# Patient Record
Sex: Male | Born: 1960 | Race: White | Hispanic: No | Marital: Married | Smoking: Never smoker
Health system: Southern US, Community
[De-identification: ages and names within clinical notes are randomized; demographics above are authoritative.]

## PROBLEM LIST (undated history)

## (undated) DIAGNOSIS — E785 Hyperlipidemia, unspecified: Secondary | ICD-10-CM

## (undated) DIAGNOSIS — T7840XA Allergy, unspecified, initial encounter: Secondary | ICD-10-CM

## (undated) DIAGNOSIS — I341 Nonrheumatic mitral (valve) prolapse: Secondary | ICD-10-CM

## (undated) DIAGNOSIS — Z8639 Personal history of other endocrine, nutritional and metabolic disease: Secondary | ICD-10-CM

## (undated) DIAGNOSIS — C4491 Basal cell carcinoma of skin, unspecified: Secondary | ICD-10-CM

## (undated) DIAGNOSIS — I10 Essential (primary) hypertension: Secondary | ICD-10-CM

## (undated) HISTORY — DX: Nonrheumatic mitral (valve) prolapse: I34.1

## (undated) HISTORY — DX: Essential (primary) hypertension: I10

## (undated) HISTORY — PX: BACK SURGERY: SHX140

## (undated) HISTORY — DX: Hyperlipidemia, unspecified: E78.5

## (undated) HISTORY — DX: Personal history of other endocrine, nutritional and metabolic disease: Z86.39

## (undated) HISTORY — DX: Allergy, unspecified, initial encounter: T78.40XA

## (undated) HISTORY — DX: Basal cell carcinoma of skin, unspecified: C44.91

---

## 1999-03-23 ENCOUNTER — Encounter: Payer: Self-pay | Admitting: Specialist

## 1999-03-23 ENCOUNTER — Ambulatory Visit (HOSPITAL_COMMUNITY): Admission: RE | Admit: 1999-03-23 | Discharge: 1999-03-23 | Payer: Self-pay | Admitting: Specialist

## 1999-04-06 ENCOUNTER — Observation Stay (HOSPITAL_COMMUNITY): Admission: RE | Admit: 1999-04-06 | Discharge: 1999-04-07 | Payer: Self-pay | Admitting: Orthopedic Surgery

## 1999-04-06 ENCOUNTER — Encounter: Payer: Self-pay | Admitting: Orthopedic Surgery

## 1999-04-06 ENCOUNTER — Encounter (INDEPENDENT_AMBULATORY_CARE_PROVIDER_SITE_OTHER): Payer: Self-pay | Admitting: Specialist

## 2001-06-23 ENCOUNTER — Encounter (INDEPENDENT_AMBULATORY_CARE_PROVIDER_SITE_OTHER): Payer: Self-pay | Admitting: Specialist

## 2001-06-23 ENCOUNTER — Observation Stay (HOSPITAL_COMMUNITY): Admission: RE | Admit: 2001-06-23 | Discharge: 2001-06-24 | Payer: Self-pay | Admitting: Orthopedic Surgery

## 2001-06-23 ENCOUNTER — Encounter: Payer: Self-pay | Admitting: Orthopedic Surgery

## 2004-08-10 ENCOUNTER — Ambulatory Visit: Payer: Self-pay | Admitting: Internal Medicine

## 2004-11-07 ENCOUNTER — Ambulatory Visit: Payer: Self-pay | Admitting: Internal Medicine

## 2005-03-30 ENCOUNTER — Ambulatory Visit: Payer: Self-pay | Admitting: Internal Medicine

## 2005-06-06 ENCOUNTER — Ambulatory Visit: Payer: Self-pay | Admitting: Internal Medicine

## 2005-06-12 ENCOUNTER — Ambulatory Visit: Payer: Self-pay | Admitting: Internal Medicine

## 2005-12-07 ENCOUNTER — Ambulatory Visit: Payer: Self-pay | Admitting: Internal Medicine

## 2006-04-11 ENCOUNTER — Ambulatory Visit: Payer: Self-pay | Admitting: Internal Medicine

## 2006-08-01 ENCOUNTER — Ambulatory Visit: Payer: Self-pay | Admitting: Internal Medicine

## 2006-10-15 DIAGNOSIS — I10 Essential (primary) hypertension: Secondary | ICD-10-CM

## 2006-10-15 DIAGNOSIS — E785 Hyperlipidemia, unspecified: Secondary | ICD-10-CM

## 2007-02-12 ENCOUNTER — Ambulatory Visit: Payer: Self-pay | Admitting: Internal Medicine

## 2007-02-12 DIAGNOSIS — C4490 Unspecified malignant neoplasm of skin, unspecified: Secondary | ICD-10-CM

## 2007-02-12 DIAGNOSIS — R9431 Abnormal electrocardiogram [ECG] [EKG]: Secondary | ICD-10-CM

## 2007-02-12 LAB — CONVERTED CEMR LAB
ALT: 77 units/L — ABNORMAL HIGH (ref 0–53)
AST: 41 units/L — ABNORMAL HIGH (ref 0–37)
BUN: 16 mg/dL (ref 6–23)
CO2: 30 meq/L (ref 19–32)
Calcium: 9.4 mg/dL (ref 8.4–10.5)
Chloride: 106 meq/L (ref 96–112)
Cholesterol: 202 mg/dL (ref 0–200)
Creatinine, Ser: 1.1 mg/dL (ref 0.4–1.5)
Direct LDL: 128.5 mg/dL
GFR calc Af Amer: 94 mL/min
GFR calc non Af Amer: 77 mL/min
Glucose, Bld: 92 mg/dL (ref 70–99)
HDL: 31.7 mg/dL — ABNORMAL LOW (ref >39.0)
Potassium: 3.9 meq/L (ref 3.5–5.1)
Sodium: 141 meq/L (ref 135–145)
Total CHOL/HDL Ratio: 6.4
Triglycerides: 159 mg/dL — ABNORMAL HIGH (ref 0–149)
VLDL: 32 mg/dL (ref 0–40)

## 2007-02-13 ENCOUNTER — Encounter (INDEPENDENT_AMBULATORY_CARE_PROVIDER_SITE_OTHER): Payer: Self-pay | Admitting: *Deleted

## 2007-04-09 ENCOUNTER — Ambulatory Visit: Payer: Self-pay | Admitting: Internal Medicine

## 2007-08-14 ENCOUNTER — Telehealth (INDEPENDENT_AMBULATORY_CARE_PROVIDER_SITE_OTHER): Payer: Self-pay | Admitting: *Deleted

## 2008-01-13 ENCOUNTER — Ambulatory Visit: Payer: Self-pay | Admitting: Internal Medicine

## 2008-03-10 ENCOUNTER — Ambulatory Visit: Payer: Self-pay | Admitting: Internal Medicine

## 2008-03-17 ENCOUNTER — Ambulatory Visit: Payer: Self-pay | Admitting: Internal Medicine

## 2008-03-22 ENCOUNTER — Encounter (INDEPENDENT_AMBULATORY_CARE_PROVIDER_SITE_OTHER): Payer: Self-pay | Admitting: *Deleted

## 2008-03-22 LAB — CONVERTED CEMR LAB
ALT: 38 units/L (ref 0–53)
AST: 22 units/L (ref 0–37)
BUN: 19 mg/dL (ref 6–23)
Basophils Absolute: 0 10*3/uL (ref 0.0–0.1)
Basophils Relative: 0.2 % (ref 0.0–3.0)
CO2: 27 meq/L (ref 19–32)
Calcium: 9.3 mg/dL (ref 8.4–10.5)
Chloride: 110 meq/L (ref 96–112)
Cholesterol: 172 mg/dL (ref 0–200)
Creatinine, Ser: 1.2 mg/dL (ref 0.4–1.5)
Eosinophils Absolute: 0.1 10*3/uL (ref 0.0–0.7)
Eosinophils Relative: 2 % (ref 0.0–5.0)
GFR calc Af Amer: 84 mL/min
GFR calc non Af Amer: 70 mL/min
Glucose, Bld: 94 mg/dL (ref 70–99)
HCT: 47 % (ref 39.0–52.0)
HDL: 30 mg/dL — ABNORMAL LOW (ref >39.0)
Hemoglobin: 16.5 g/dL (ref 13.0–17.0)
LDL Cholesterol: 116 mg/dL — ABNORMAL HIGH (ref 0–99)
Lymphocytes Relative: 27.6 % (ref 12.0–46.0)
MCHC: 35.2 g/dL (ref 30.0–36.0)
MCV: 91.2 fL (ref 78.0–100.0)
Monocytes Absolute: 0.5 10*3/uL (ref 0.1–1.0)
Monocytes Relative: 8.6 % (ref 3.0–12.0)
Neutro Abs: 3.7 10*3/uL (ref 1.4–7.7)
Neutrophils Relative %: 61.6 % (ref 43.0–77.0)
Platelets: 204 10*3/uL (ref 150–400)
Potassium: 4.3 meq/L (ref 3.5–5.1)
RBC: 5.15 M/uL (ref 4.22–5.81)
RDW: 12.5 % (ref 11.5–14.6)
Sodium: 142 meq/L (ref 135–145)
TSH: 1.03 microintl units/mL (ref 0.35–5.50)
Total CHOL/HDL Ratio: 5.7
Triglycerides: 129 mg/dL (ref 0–149)
VLDL: 26 mg/dL (ref 0–40)
WBC: 5.9 10*3/uL (ref 4.5–10.5)

## 2008-05-10 ENCOUNTER — Ambulatory Visit: Payer: Self-pay | Admitting: Internal Medicine

## 2008-05-10 DIAGNOSIS — R5383 Other fatigue: Secondary | ICD-10-CM

## 2008-05-10 DIAGNOSIS — R634 Abnormal weight loss: Secondary | ICD-10-CM

## 2008-05-10 DIAGNOSIS — R5381 Other malaise: Secondary | ICD-10-CM | POA: Insufficient documentation

## 2008-05-10 LAB — CONVERTED CEMR LAB
Blood Glucose, Fingerstick: 114
Hemoglobin: 15.9 g/dL

## 2008-05-11 ENCOUNTER — Ambulatory Visit: Payer: Self-pay | Admitting: Internal Medicine

## 2008-05-11 ENCOUNTER — Encounter (INDEPENDENT_AMBULATORY_CARE_PROVIDER_SITE_OTHER): Payer: Self-pay | Admitting: *Deleted

## 2008-05-12 ENCOUNTER — Ambulatory Visit: Payer: Self-pay | Admitting: Internal Medicine

## 2008-05-12 LAB — CONVERTED CEMR LAB: Fecal Occult Bld: NEGATIVE

## 2008-05-17 ENCOUNTER — Telehealth (INDEPENDENT_AMBULATORY_CARE_PROVIDER_SITE_OTHER): Payer: Self-pay | Admitting: *Deleted

## 2008-05-17 LAB — CONVERTED CEMR LAB
ALT: 45 units/L (ref 0–53)
ANA Titer 1: NEGATIVE
AST: 42 units/L — ABNORMAL HIGH (ref 0–37)
Albumin: 4.1 g/dL (ref 3.5–5.2)
Alkaline Phosphatase: 59 units/L (ref 39–117)
Anti Nuclear Antibody(ANA): POSITIVE — AB
Basophils Absolute: 0 10*3/uL (ref 0.0–0.1)
Basophils Relative: 0 % (ref 0.0–3.0)
Bilirubin, Direct: 0.2 mg/dL (ref 0.0–0.3)
Eosinophils Absolute: 0.2 10*3/uL (ref 0.0–0.7)
Eosinophils Relative: 3.1 % (ref 0.0–5.0)
FSH: 4.2 milliintl units/mL
HCT: 43.2 % (ref 39.0–52.0)
Hemoglobin: 15.1 g/dL (ref 13.0–17.0)
LH: 4.3 milliintl units/mL
Lymphocytes Relative: 29.9 % (ref 12.0–46.0)
MCHC: 35 g/dL (ref 30.0–36.0)
MCV: 90.8 fL (ref 78.0–100.0)
Monocytes Absolute: 0.3 10*3/uL (ref 0.1–1.0)
Monocytes Relative: 3.9 % (ref 3.0–12.0)
Neutro Abs: 4.1 10*3/uL (ref 1.4–7.7)
Neutrophils Relative %: 63.1 % (ref 43.0–77.0)
Platelets: 238 10*3/uL (ref 150–400)
RBC: 4.76 M/uL (ref 4.22–5.81)
RDW: 11.7 % (ref 11.5–14.6)
Rheumatoid fact SerPl-aCnc: <20 intl units/mL — ABNORMAL LOW (ref 0.0–20.0)
Sed Rate: 7 mm/hr (ref 0–16)
Testosterone: 295.68 ng/dL — ABNORMAL LOW (ref 350.00–890)
Total Bilirubin: 1 mg/dL (ref 0.3–1.2)
Total Protein: 6.5 g/dL (ref 6.0–8.3)
WBC: 6.5 10*3/uL (ref 4.5–10.5)

## 2008-05-19 ENCOUNTER — Encounter (INDEPENDENT_AMBULATORY_CARE_PROVIDER_SITE_OTHER): Payer: Self-pay | Admitting: *Deleted

## 2008-06-08 ENCOUNTER — Ambulatory Visit: Payer: Self-pay | Admitting: Internal Medicine

## 2008-06-08 ENCOUNTER — Telehealth: Payer: Self-pay | Admitting: Internal Medicine

## 2008-06-09 ENCOUNTER — Telehealth (INDEPENDENT_AMBULATORY_CARE_PROVIDER_SITE_OTHER): Payer: Self-pay | Admitting: *Deleted

## 2008-07-15 ENCOUNTER — Encounter: Payer: Self-pay | Admitting: Internal Medicine

## 2008-08-16 ENCOUNTER — Encounter: Payer: Self-pay | Admitting: Internal Medicine

## 2008-11-03 ENCOUNTER — Encounter: Payer: Self-pay | Admitting: Internal Medicine

## 2009-03-24 ENCOUNTER — Ambulatory Visit: Payer: Self-pay | Admitting: Internal Medicine

## 2009-05-18 ENCOUNTER — Encounter: Payer: Self-pay | Admitting: Internal Medicine

## 2009-06-02 ENCOUNTER — Ambulatory Visit: Payer: Self-pay | Admitting: Internal Medicine

## 2009-06-07 LAB — CONVERTED CEMR LAB
AST: 25 units/L (ref 0–37)
BUN: 15 mg/dL (ref 6–23)
Basophils Absolute: 0.1 10*3/uL (ref 0.0–0.1)
CO2: 29 meq/L (ref 19–32)
Calcium: 10.1 mg/dL (ref 8.4–10.5)
Chloride: 102 meq/L (ref 96–112)
Creatinine, Ser: 1.1 mg/dL (ref 0.4–1.5)
Eosinophils Absolute: 0.2 10*3/uL (ref 0.0–0.7)
Glucose, Bld: 85 mg/dL (ref 70–99)
HDL: 33 mg/dL — ABNORMAL LOW (ref >39.00)
Lymphocytes Relative: 23.3 % (ref 12.0–46.0)
MCHC: 35.3 g/dL (ref 30.0–36.0)
MCV: 90.7 fL (ref 78.0–100.0)
Monocytes Absolute: 0.5 10*3/uL (ref 0.1–1.0)
Neutrophils Relative %: 67.4 % (ref 43.0–77.0)
Platelets: 223 10*3/uL (ref 150.0–400.0)
RDW: 12.1 % (ref 11.5–14.6)
TSH: 0.98 microintl units/mL (ref 0.35–5.50)
Triglycerides: 185 mg/dL — ABNORMAL HIGH (ref 0.0–149.0)

## 2009-06-20 ENCOUNTER — Encounter (INDEPENDENT_AMBULATORY_CARE_PROVIDER_SITE_OTHER): Payer: Self-pay | Admitting: *Deleted

## 2009-06-20 ENCOUNTER — Ambulatory Visit: Payer: Self-pay | Admitting: Internal Medicine

## 2009-06-22 ENCOUNTER — Encounter (INDEPENDENT_AMBULATORY_CARE_PROVIDER_SITE_OTHER): Payer: Self-pay | Admitting: *Deleted

## 2009-06-25 HISTORY — PX: VARICOCELE EXCISION: SUR582

## 2009-07-01 ENCOUNTER — Ambulatory Visit (HOSPITAL_BASED_OUTPATIENT_CLINIC_OR_DEPARTMENT_OTHER): Admission: RE | Admit: 2009-07-01 | Discharge: 2009-07-01 | Payer: Self-pay | Admitting: Urology

## 2009-07-26 ENCOUNTER — Ambulatory Visit: Payer: Self-pay | Admitting: Family Medicine

## 2009-07-26 LAB — CONVERTED CEMR LAB: Inflenza A Ag: NEGATIVE

## 2009-09-15 IMAGING — CR DG CHEST 2V
2 series · 2 of 2 positions shown · non-contrast
Comparison: 08/01/2006

CLINICAL DATA: Weight loss, chronic cough and shortness of breath.

CHEST - 2 VIEW

[view not recorded (1 of 2)]
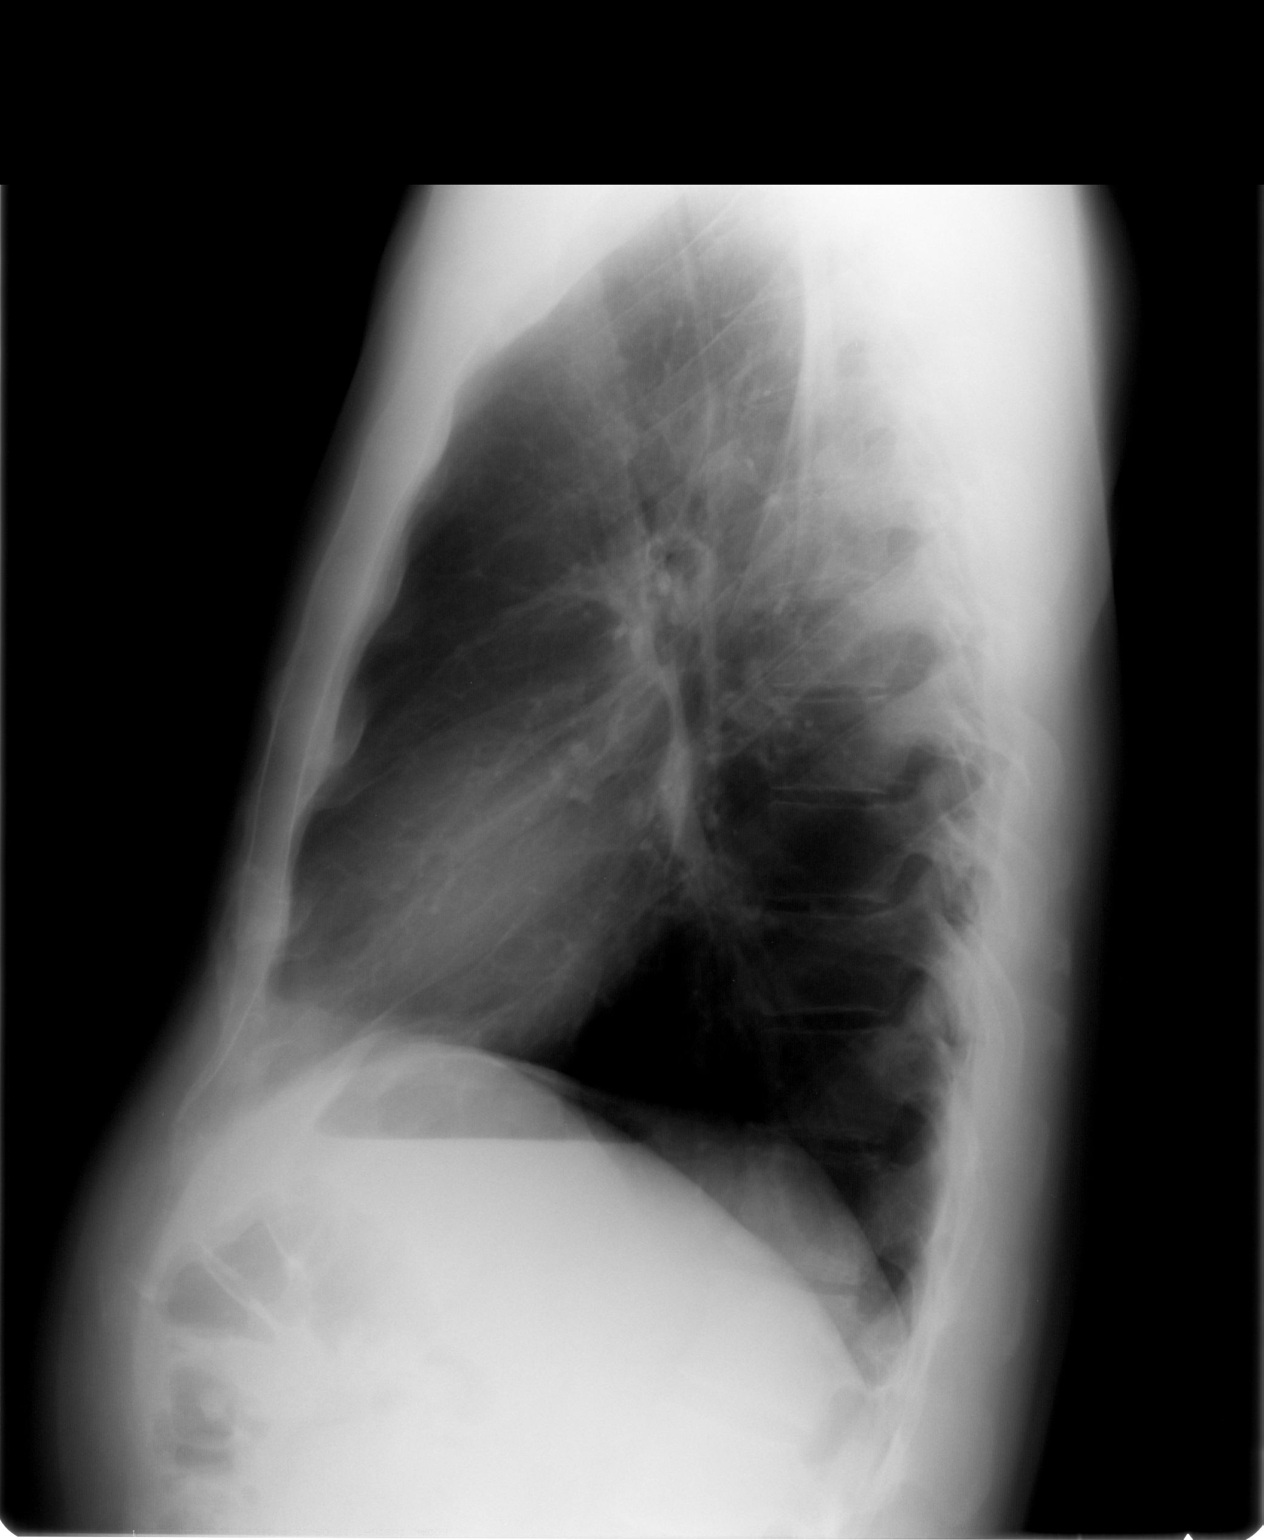

[view not recorded (2 of 2)]
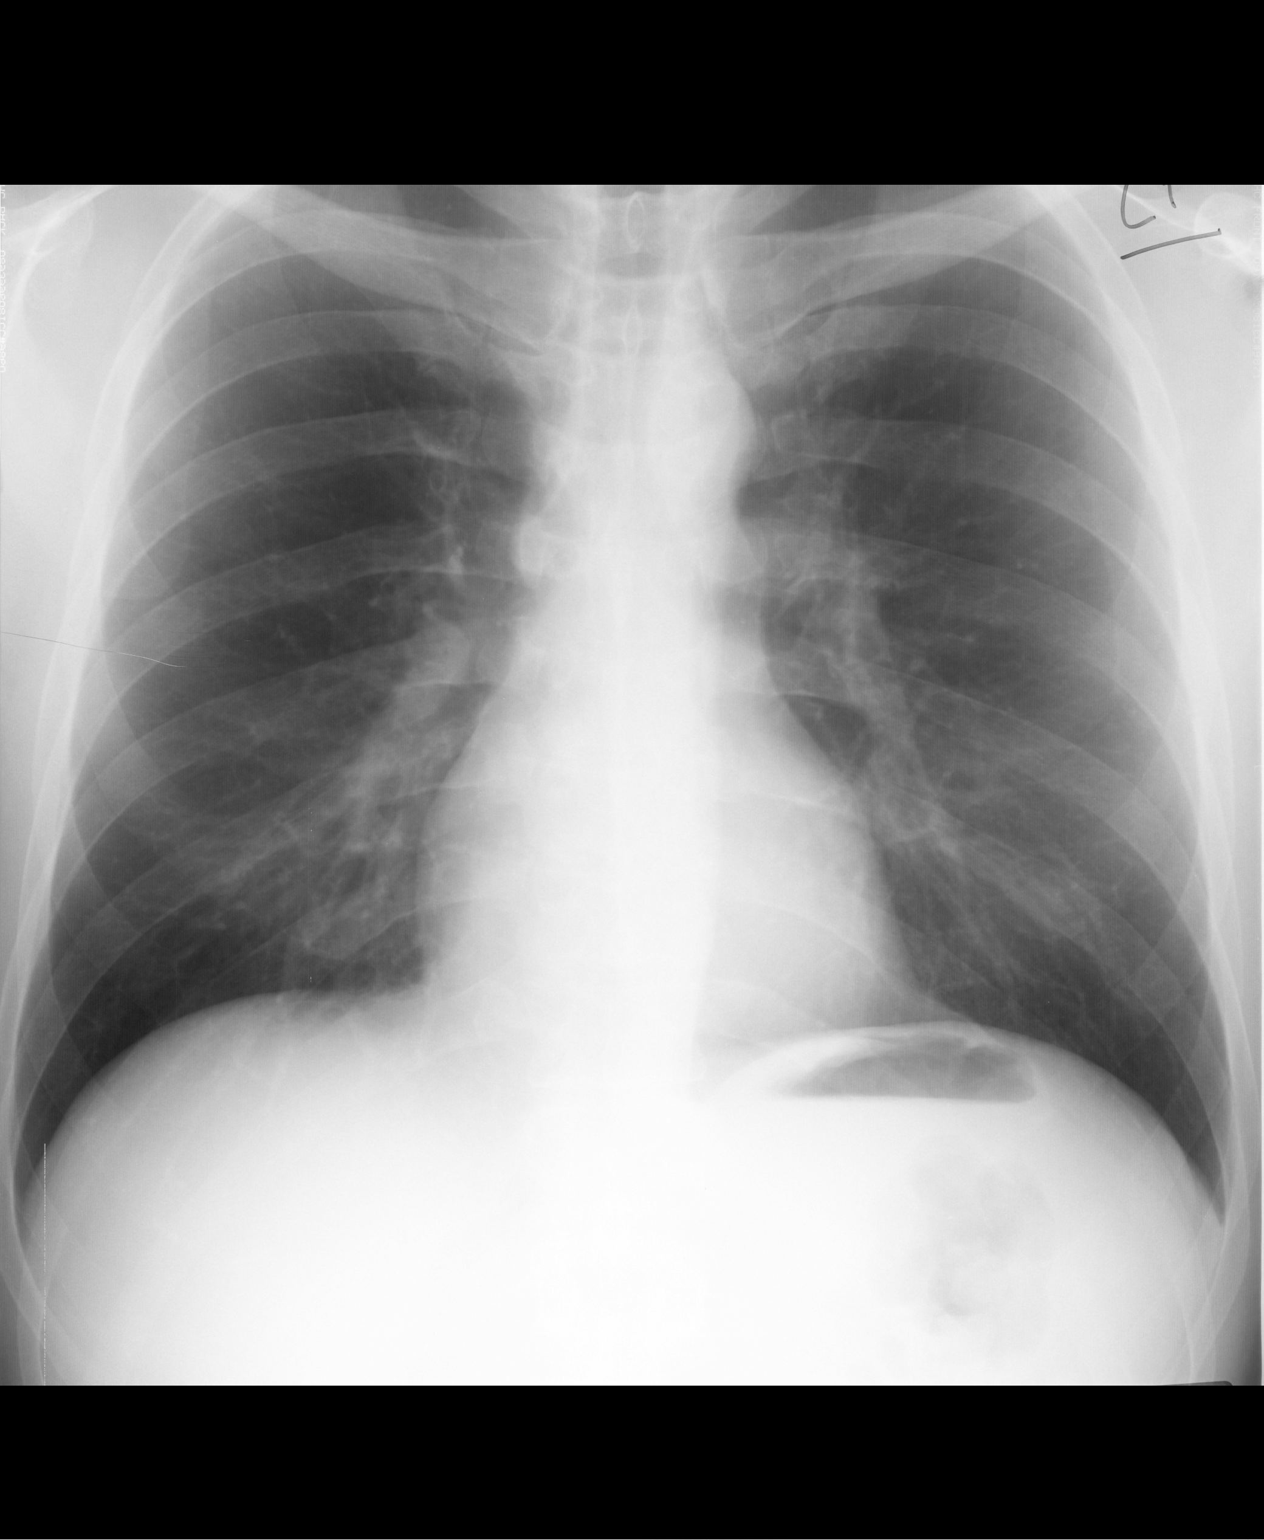

[2 of 2 positions shown; findings below may reference images not displayed]

FINDINGS: The cardiac silhouette, mediastinal and hilar contours
are within normal limits and stable. The lungs are clear.  No
pleural effusions. Prominent infrahilar vessel on the right due to
a mild pectus deformity.  Intact bony thorax.
IMPRESSION: 1.  No acute cardiopulmonary findings.  Stable appearance of the
chest since 6997.

## 2009-11-02 ENCOUNTER — Encounter: Payer: Self-pay | Admitting: Internal Medicine

## 2010-06-06 ENCOUNTER — Ambulatory Visit: Payer: Self-pay | Admitting: Internal Medicine

## 2010-06-07 ENCOUNTER — Encounter (INDEPENDENT_AMBULATORY_CARE_PROVIDER_SITE_OTHER): Payer: Self-pay | Admitting: *Deleted

## 2010-06-09 LAB — CONVERTED CEMR LAB
Basophils Absolute: 0 10*3/uL (ref 0.0–0.1)
Basophils Relative: 0.5 % (ref 0.0–3.0)
Bilirubin, Direct: 0.1 mg/dL (ref 0.0–0.3)
CO2: 28 meq/L (ref 19–32)
Calcium: 9.4 mg/dL (ref 8.4–10.5)
Direct LDL: 150.3 mg/dL
GFR calc non Af Amer: 100.86 mL/min (ref >60.00)
Glucose, Bld: 91 mg/dL (ref 70–99)
HCT: 45 % (ref 39.0–52.0)
HDL: 35.5 mg/dL — ABNORMAL LOW (ref >39.00)
Hemoglobin: 15.4 g/dL (ref 13.0–17.0)
Lymphocytes Relative: 25.3 % (ref 12.0–46.0)
Lymphs Abs: 1.9 10*3/uL (ref 0.7–4.0)
Monocytes Relative: 6 % (ref 3.0–12.0)
Neutro Abs: 5 10*3/uL (ref 1.4–7.7)
Potassium: 4.1 meq/L (ref 3.5–5.1)
RBC: 4.96 M/uL (ref 4.22–5.81)
RDW: 13.4 % (ref 11.5–14.6)
Sodium: 139 meq/L (ref 135–145)
TSH: 0.8 microintl units/mL (ref 0.35–5.50)
Total Bilirubin: 0.9 mg/dL (ref 0.3–1.2)
Total CHOL/HDL Ratio: 6
VLDL: 23 mg/dL (ref 0.0–40.0)

## 2010-06-29 ENCOUNTER — Other Ambulatory Visit: Payer: Self-pay | Admitting: Internal Medicine

## 2010-06-29 ENCOUNTER — Ambulatory Visit
Admission: RE | Admit: 2010-06-29 | Discharge: 2010-06-29 | Payer: Self-pay | Source: Home / Self Care | Attending: Internal Medicine | Admitting: Internal Medicine

## 2010-06-29 LAB — FECAL OCCULT BLOOD, IMMUNOCHEMICAL: Fecal Occult Bld: NEGATIVE

## 2010-07-25 NOTE — Assessment & Plan Note (Signed)
Summary: C OLD?/KDC   Vital Signs:  Patient profile:   50 year old male Weight:      190 pounds Temp:     98.4 degrees F oral Pulse rate:   88 / minute Pulse rhythm:   regular BP sitting:   124 / 80  (left arm) Cuff size:   regular  Vitals Entered By: Army Fossa CMA (July 26, 2009 11:04 AM) CC: Pt c/o friday had fever of 102, chest congestion, nasal congestion. Has not had fever since friday/saturday. , Cough   History of Present Illness:  Cough      This is a 50 year old man who presents with Cough.  The symptoms began 5 days ago.  The patient reports productive cough and fever, but denies non-productive cough, pleuritic chest pain, shortness of breath, wheezing, exertional dyspnea, hemoptysis, and malaise.  Associated symtpoms include cold/URI symptoms.  The patient denies the following symptoms: sore throat, nasal congestion, chronic rhinitis, weight loss, acid reflux symptoms, and peripheral edema.  The cough is worse with activity.  Ineffective prior treatments have included OTC cough medication.    Current Medications (verified): 1)  Lisinopril 20 Mg Tabs (Lisinopril) .... Two Times A Day 2)  Zithromax Z-Pak 250 Mg Tabs (Azithromycin) .... As Directed 3)  Proair Hfa 108 (90 Base) Mcg/act Aers (Albuterol Sulfate) .... 2 Puffs Qid As Needed  Allergies (verified): No Known Drug Allergies  Past History:  Past medical, surgical, family and social histories (including risk factors) reviewed for relevance to current acute and chronic problems.  Past Medical History: Reviewed history from 06/02/2009 and no changes required. Hyperlipidemia Hypertension NEOP, MALIGNANT, SKIN (face, sees dermatology)  ? of MITRAL VALVE PROLAPSE (Dx in the 90s,no need for SBE prophy per new guidelines) sees Dr Talmage Nap for low testosterone  Past Surgical History: Reviewed history from 02/12/2007 and no changes required. BACK SURGER 2000/2002  Family History: Reviewed history from  06/02/2009 and no changes required. Father:SCC of skin, arrythmia Mother: increased chol Siblings: on coumadin reason? arrythmia MI --GF 49 y/o colon Ca-- GF age early 27s  DM--no prostate cancer--no  Social History: Reviewed history from 06/02/2009 and no changes required. Married bussines analyst Alcohol use-yes tobacco--no no children exercise , very little  diet-- does watch   Review of Systems      See HPI  Physical Exam  General:  Well-developed,well-nourished,in no acute distress; alert,appropriate and cooperative throughout examination Ears:  External ear exam shows no significant lesions or deformities.  Otoscopic examination reveals clear canals, tympanic membranes are intact bilaterally without bulging, retraction, inflammation or discharge. Hearing is grossly normal bilaterally. Nose:  External nasal examination shows no deformity or inflammation. Nasal mucosa are pink and moist without lesions or exudates. Mouth:  Oral mucosa and oropharynx without lesions or exudates.  Teeth in good repair. Neck:  No deformities, masses, or tenderness noted. Lungs:  R decreased breath sounds and L decreased breath sounds-----improved after neb with albuterol Heart:  normal rate and no murmur.     Impression & Recommendations:  Problem # 1:  BRONCHITIS- ACUTE (ICD-466.0)  His updated medication list for this problem includes:    Zithromax Z-pak 250 Mg Tabs (Azithromycin) .Marland Kitchen... As directed    Proair Hfa 108 (90 Base) Mcg/act Aers (Albuterol sulfate) .Marland Kitchen... 2 puffs qid as needed  Take antibiotics and other medications as directed. Encouraged to push clear liquids, get enough rest, and take acetaminophen as needed. To be seen in 5-7 days if no improvement, sooner  if worse.  Orders: Nebulizer Tx (16109) Albuterol Sulfate Sol 1mg  unit dose (U0454)  Complete Medication List: 1)  Lisinopril 20 Mg Tabs (Lisinopril) .... Two times a day 2)  Zithromax Z-pak 250 Mg Tabs  (Azithromycin) .... As directed 3)  Proair Hfa 108 (90 Base) Mcg/act Aers (Albuterol sulfate) .... 2 puffs qid as needed Prescriptions: PROAIR HFA 108 (90 BASE) MCG/ACT AERS (ALBUTEROL SULFATE) 2 puffs qid as needed  #1 x 0   Entered and Authorized by:   Loreen Freud DO   Signed by:   Loreen Freud DO on 07/26/2009   Method used:   Historical   RxID:   [id number] ZITHROMAX Z-PAK 250 MG TABS (AZITHROMYCIN) as directed  #1 x 0   Entered and Authorized by:   Loreen Freud DO   Signed by:   Loreen Freud DO on 07/26/2009   Method used:   Electronically to        [facility]* (retail)       [address]       [place], [place]  [id number]       Ph: [phone]       Fax: [phone]   RxID:   [id number]   Laboratory Results    Other Tests  Influenza A: negative Comments: Army Fossa CMA  July 26, 2009 11:34 AM     Medication Administration  Medication # 1:    Medication: Albuterol Sulfate Sol 1mg  unit dose    Diagnosis: BRONCHITIS- ACUTE (ICD-466.0)  Orders Added: 1)  Est. Patient Level IV [87564] 2)  Nebulizer Tx [33295] 3)  Albuterol Sulfate Sol 1mg  unit dose [J8841]

## 2010-07-25 NOTE — Letter (Signed)
Summary: [facility]  [facility]   Imported By: Lanelle Bal 11/10/2009 13:00:13  _____________________________________________________________________  External Attachment:    Type:   Image     Comment:   External Document

## 2010-07-27 NOTE — Assessment & Plan Note (Signed)
Summary: cpx//pt will be fasting//lch   Vital Signs:  Patient profile:   50 year old male Height:      75 inches Weight:      188.25 pounds BMI:     23.61 Pulse rate:   79 / minute Pulse rhythm:   regular BP sitting:   134 / 88  (left arm) Cuff size:   regular  Vitals Entered By: Army Fossa CMA (June 06, 2010 9:37 AM) CC: CPX, fasting  Comments [facility]    History of Present Illness: cpx   Current Medications (verified): 1)  Lisinopril 20 Mg Tabs (Lisinopril) .... Two Times A Day  Allergies (verified): No Known Drug Allergies  Past History:  Past Medical History: Hyperlipidemia Hypertension NEOP, MALIGNANT, SKIN (face, sees dermatology)  ? of MITRAL VALVE PROLAPSE (Dx in the 90s,no need for SBE prophy per new guidelines) sees Dr Talmage Nap for low testosterone, problem self resolve (2011) discharge from endocrine  Past Surgical History: BACK SURGER 2000/2002 varicocele correction 2011  Family History: Reviewed history from 06/02/2009 and no changes required. Father:SCC of skin, arrythmia Mother: increased chol Siblings: on coumadin reason? 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 CV:  Denies chest pain or discomfort, palpitations, and swelling of feet. Resp:  Denies cough and wheezing. GI:  Denies bloody stools, nausea, and vomiting. GU:  Denies dysuria, hematuria, urinary frequency, and urinary hesitancy. Derm:  sees derm routinely . Psych:  Denies anxiety and depression. Endo:  testosterone back to normal w/o Rx, d/c from endocrinology.  Physical Exam  General:  alert, well-developed, and well-nourished.   Neck:  No deformities, masses, or tenderness noted. no  thyromegaly Lungs:  normal respiratory effort, no intercostal retractions, no accessory muscle use, and normal breath sounds.   Heart:  normal rate, regular rhythm, and no murmur.   Abdomen:  soft, non-tender, no distention, no masses, no guarding, and no rigidity.   Extremities:  no pretibial edema bilaterally  Psych:  Cognition and judgment appear intact. Alert and cooperative with normal attention span and concentration.  not anxious appearing and not depressed appearing.     Impression & Recommendations:  Problem # 1:  HEALTH MAINTENANCE EXAM (ICD-V70.0)  Td 2006 had a  flu shot  03-2010  never had a Cscope , (-) iFOB 05-2009 grandfather has a history of colon cancer, d/w patient Cscope Vs iFOB iFOB provided, Cscope if so desire . definitely needs a colonoscopy at age 48  seems to be eating healthy encourage to exercise more particularly during the winter    Orders: Venipuncture (78469) TLB-BMP (Basic Metabolic Panel-BMET) (80048-METABOL) TLB-CBC Platelet - w/Differential (85025-CBCD) TLB-Lipid Panel (80061-LIPID) TLB-TSH (Thyroid Stimulating Hormone) (84443-TSH) TLB-Hepatic/Liver Function Pnl (80076-HEPATIC) Specimen Handling (62952)  Problem # 2:  HYPERTENSION (ICD-401.9) diastolic BP slightly elevated, see instructions will call for refills His updated medication list for this problem includes:    Lisinopril 20 Mg Tabs (Lisinopril) .Marland Kitchen..Marland Kitchen Two times a day  BP today: 134/88 Prior BP: 124/80 (07/26/2009)  Labs Reviewed: K+: 4.2 (06/02/2009) Creat: : 1.1 (06/02/2009)   Chol: 230 (06/02/2009)   HDL: 33.00 (06/02/2009)   LDL: 116 (03/17/2008)   TG: 185.0 (06/02/2009)  Problem # 3:  HYPERLIPIDEMIA (ICD-272.4) labs Labs Reviewed: SGOT: 25 (  06/02/2009)   SGPT: 33 (06/02/2009)   HDL:33.00 (06/02/2009), 30.0 (03/17/2008)  LDL:116 (03/17/2008), DEL (02/12/2007)  Chol:230 (06/02/2009), 172 (03/17/2008)  Trig:185.0 (06/02/2009), 129 (03/17/2008)  Complete Medication  List: 1)  Lisinopril 20 Mg Tabs (Lisinopril) .... Two times a day  Patient Instructions: 1)  Check your blood pressure 2 or 3 times a week. If it is more than 140/85 consistently,please let us know  2)  return the iFOB 3)  Please schedule a follow-up appointment in 1 year.    Orders Added: 1)  Venipuncture [36415] 2)  TLB-BMP (Basic Metabolic Panel-BMET) [80048-METABOL] 3)  TLB-CBC Platelet - w/Differential [85025-CBCD] 4)  TLB-Lipid Panel [80061-LIPID] 5)  TLB-TSH (Thyroid Stimulating Hormone) [84443-TSH] 6)  TLB-Hepatic/Liver Function Pnl [80076-HEPATIC] 7)  Specimen Handling [99000] 8)  Est. Patient age 22-64 [phone]

## 2010-07-27 NOTE — Letter (Signed)
Summary: [facility]: Immunoassay Fecal Occult Blood (iFOB) Order Form  [facility] [place]  [address] [place], [place] [address]   Phone: [phone]  Fax: [phone]      [facility]: Immunoassay Fecal Occult Blood (iFOB) Order Form   June 07, 2010 MRN: [id number]   ZYREE [facility] 18-Jul-1960   Physicican Name:_jose paz,md ________________________  Diagnosis Code:____v76.51______________________      Army Fossa CMA

## 2010-09-10 LAB — POCT I-STAT, CHEM 8
Creatinine, Ser: 1 mg/dL (ref 0.4–1.5)
Glucose, Bld: 98 mg/dL (ref 70–99)
HCT: 47 % (ref 39.0–52.0)
Hemoglobin: 16 g/dL (ref 13.0–17.0)
Potassium: 4.8 mEq/L (ref 3.5–5.1)
Sodium: 139 mEq/L (ref 135–145)
TCO2: 26 mmol/L (ref 0–100)

## 2010-11-10 NOTE — Op Note (Signed)
[facility]  Patient:    Gary Curry, Gary Curry Visit Number: [id number] MRN: [id number]          Service Type: OBV Location: 4W 0466 01 Attending Physician:  Skip Mayer Dictated by:   Georges Lynch Darrelyn Hillock, M.D. Proc. Date: 06/23/01 Admit Date:  06/23/2001                             Operative Report  SURGEON:  Windy Fast A. Darrelyn Hillock, M.D.  ASSISTANT:  Javier Docker, M.D.  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 He had toe extensor weakness preop. Note he had a previous lumbar laminectomy at the same level in October 2000.  POSTOPERATIVE DIAGNOSES:  Not given.  DESCRIPTION OF PROCEDURE:  With the patient on a spinal frame, routine orthopedic prep and draping of the back was carried out. The patient had 2 gm of ampicillin. Following this, an incision was made through the old incision site. I identified what I felt was the spinous process of L4. I went down and identified the L4-5 space on the right and an x-ray was taken that verified our exact position. I then removed all the scar tissue between the lamina and this area. I then carried out a hemilaminectomy in the usual fashion. I went down and identified the dura and the nerve root. The nerve root was severely compressed. He had a lateral recessed stenosis. Once I decompressed the lateral recess and did a foraminotomy, I then was able to easily get a Penfield 4 up under the nerve root. Once this was done, large fragments of disk were extruded out up under the root and they were removed with the nerve hook. I then went down in the disk space and completed the diskectomy. Multiple pieces of disk material were removed. Once this was completed, the nerve root now was free. We were able to easily get the dura over. We then went down with a hockey stick into the foramina and the root was clear. We thoroughly irrigated out the area. I loosely applied some  thrombin soaked Gelfoam and the wound was closed in layers in the usual fashion. The patient left the operating room in satisfactory condition after a sterile Neosporin dressing was applied. Dictated by:   Georges Lynch Darrelyn Hillock, M.D. Attending Physician:  Skip Mayer DD:  06/23/01 TD:  06/23/01 Job: [id number]

## 2011-06-11 ENCOUNTER — Encounter: Payer: Self-pay | Admitting: Internal Medicine

## 2011-06-12 ENCOUNTER — Ambulatory Visit (INDEPENDENT_AMBULATORY_CARE_PROVIDER_SITE_OTHER): Payer: 59 | Admitting: Internal Medicine

## 2011-06-12 DIAGNOSIS — E291 Testicular hypofunction: Secondary | ICD-10-CM

## 2011-06-12 DIAGNOSIS — Z0000 Encounter for general adult medical examination without abnormal findings: Secondary | ICD-10-CM

## 2011-06-12 DIAGNOSIS — I10 Essential (primary) hypertension: Secondary | ICD-10-CM

## 2011-06-12 LAB — LIPID PANEL
Cholesterol: 228 mg/dL — ABNORMAL HIGH (ref 0–200)
HDL: 43.3 mg/dL (ref >39.00)
Total CHOL/HDL Ratio: 5
Triglycerides: 152 mg/dL — ABNORMAL HIGH (ref 0.0–149.0)
VLDL: 30.4 mg/dL (ref 0.0–40.0)

## 2011-06-12 LAB — BASIC METABOLIC PANEL
BUN: 19 mg/dL (ref 6–23)
Chloride: 105 mEq/L (ref 96–112)
Creatinine, Ser: 1 mg/dL (ref 0.4–1.5)
GFR: 86.38 mL/min (ref >60.00)
Potassium: 4.6 mEq/L (ref 3.5–5.1)

## 2011-06-12 LAB — ALT: ALT: 41 U/L (ref 0–53)

## 2011-06-12 LAB — CBC WITH DIFFERENTIAL/PLATELET
Basophils Relative: 0.7 % (ref 0.0–3.0)
Eosinophils Relative: 2.7 % (ref 0.0–5.0)
Monocytes Relative: 6.6 % (ref 3.0–12.0)
Neutrophils Relative %: 62.3 % (ref 43.0–77.0)
Platelets: 232 10*3/uL (ref 150.0–400.0)
RBC: 5.12 Mil/uL (ref 4.22–5.81)
WBC: 6.5 10*3/uL (ref 4.5–10.5)

## 2011-06-12 LAB — TSH: TSH: 0.88 u[IU]/mL (ref 0.35–5.50)

## 2011-06-12 LAB — AST: AST: 27 U/L (ref 0–37)

## 2011-06-12 LAB — LDL CHOLESTEROL, DIRECT: Direct LDL: 155.5 mg/dL

## 2011-06-12 MED ORDER — LISINOPRIL 20 MG PO TABS: 20.0000 mg | ORAL_TABLET | Freq: Every day | ORAL | Status: DC

## 2011-06-12 NOTE — Assessment & Plan Note (Addendum)
At some point, his testosterone was low, it self resolved. We are checking his testosterone today, it is normal, he does not need to continue seeing endocrinology, we could continue monitoring his testosterone from time to time .

## 2011-06-12 NOTE — Assessment & Plan Note (Signed)
Reports good amb BPs and med compliance

## 2011-06-12 NOTE — Progress Notes (Signed)
  Subjective:    Patient ID: Gary Curry, male    DOB: May 31, 1961, 50 y.o.   MRN: [id number]  HPI CPX Feels well, likes his testosterone check  Past Medical History: Hyperlipidemia Hypertension NEOP, MALIGNANT, SKIN (face, sees dermatology)  ? 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 Fatigue: occ fatigue, "not really an issue"  Respiratory: Negative for cough and shortness of breath.   Cardiovascular: Negative for chest pain and leg swelling.  Gastrointestinal: Negative for abdominal pain and blood in stool.  Genitourinary: Negative for dysuria and hematuria.       No ED  Psychiatric/Behavioral:       No anxiety-depression       Objective:   Physical Exam  Constitutional: He appears well-developed and well-nourished. No distress.  HENT:  Head: Normocephalic and atraumatic.  Neck: No thyromegaly present.  Cardiovascular: Normal rate, regular rhythm and normal heart sounds.   No murmur heard. Pulmonary/Chest: Effort normal and breath sounds normal. No respiratory distress. He has no wheezes. He has no rales.  Abdominal: Soft. Bowel sounds are normal. He exhibits no distension. There is no tenderness. There is no rebound and no guarding.  Genitourinary: Rectum normal and prostate normal. Guaiac negative stool.  Musculoskeletal: He exhibits no  edema.  Neurological: He is alert.  Skin: He is not diaphoretic.  Psychiatric: He has a normal mood and affect. His behavior is normal. Judgment and thought content normal.      Assessment & Plan:

## 2011-06-12 NOTE — Patient Instructions (Signed)
Exercise daily ! Watch your diet Next visit in 1 year and as needed Check the  blood pressure 2 or 3 times a week, be sure it is less than 135/85. If it is consistently higher, let me know

## 2011-06-12 NOTE — Assessment & Plan Note (Addendum)
Td 2006 had a  flu shot  For 2012  never had a Cscope , (-) iFOB 2011 grandfather has a history of colon cancer, ready to have a cscope at age 50 iFOB provided encourage to exercise more particularly during the winter

## 2011-06-13 ENCOUNTER — Encounter: Payer: Self-pay | Admitting: Internal Medicine

## 2011-06-13 ENCOUNTER — Encounter: Payer: Self-pay | Admitting: *Deleted

## 2011-06-17 ENCOUNTER — Encounter: Payer: Self-pay | Admitting: Internal Medicine

## 2011-07-03 ENCOUNTER — Other Ambulatory Visit: Payer: Self-pay | Admitting: Internal Medicine

## 2011-07-03 ENCOUNTER — Encounter: Payer: Self-pay | Admitting: Internal Medicine

## 2011-07-03 ENCOUNTER — Other Ambulatory Visit: Payer: 59

## 2011-07-03 DIAGNOSIS — Z1211 Encounter for screening for malignant neoplasm of colon: Secondary | ICD-10-CM

## 2011-07-03 LAB — FECAL OCCULT BLOOD, IMMUNOCHEMICAL: Fecal Occult Bld: NEGATIVE

## 2012-06-20 ENCOUNTER — Encounter: Payer: Self-pay | Admitting: Internal Medicine

## 2012-06-20 ENCOUNTER — Ambulatory Visit (INDEPENDENT_AMBULATORY_CARE_PROVIDER_SITE_OTHER): Payer: 59 | Admitting: Internal Medicine

## 2012-06-20 VITALS — BP 140/86 | HR 92 | Temp 98.0°F | Ht 75.0 in | Wt 193.0 lb

## 2012-06-20 DIAGNOSIS — Z0000 Encounter for general adult medical examination without abnormal findings: Secondary | ICD-10-CM

## 2012-06-20 DIAGNOSIS — I10 Essential (primary) hypertension: Secondary | ICD-10-CM

## 2012-06-20 MED ORDER — LISINOPRIL 20 MG PO TABS: 20.0000 mg | ORAL_TABLET | Freq: Every day | ORAL | Status: DC

## 2012-06-20 NOTE — Progress Notes (Signed)
  Subjective:    Patient ID: Gary Curry, male    DOB: 05-Nov-1960, 51 y.o.   MRN: [id number]  HPI CPX   Past Medical History: Hyperlipidemia Hypertension NEOP, MALIGNANT, SKIN (face, sees dermatology)   ? 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 No chest pain or shortness of breath Denies nausea, vomiting, diarrhea or blood in the stools. No dysuria or gross hematuria No anxiety or depression    Objective:   Physical Exam  General -- alert, well-developed, and well-nourished.   Neck --no thyromegaly Lungs -- normal respiratory effort, no intercostal retractions, no accessory muscle use, and normal breath sounds.   Heart-- normal rate, regular rhythm, no murmur, and no gallop.   Abdomen--soft, non-tender, no distention, no masses, no HSM, no guarding, and no rigidity.   Extremities-- no pretibial edema bilaterally Rectal-- No external abnormalities noted. Normal sphincter tone. No rectal masses or tenderness. Brown stool, Hemoccult negative Prostate:  Prostate gland firm and smooth, no enlargement, nodularity, tenderness, mass, asymmetry or induration. Neurologic-- alert & oriented X3 and strength normal in all extremities. Psych-- Cognition and judgment appear intact. Alert and cooperative with  normal attention span and concentration.  not anxious appearing and not depressed appearing.       Assessment & Plan:

## 2012-06-20 NOTE — Assessment & Plan Note (Addendum)
Td 2006 had a  flu shot   never had a Cscope , grandfather has a history of colon cancer --> refer to GI Continue with healthy diet, exercise! Labs Reports  a 2 mm induration at the fourth left digit, exam is confirmatory, saw dermatology few days ago, was told was probably a cyst. I agree, if the area gets worse, bigger or start hurting will let me know for orthopedic referral.

## 2012-06-20 NOTE — Assessment & Plan Note (Signed)
Ambulatory BPs always less than 120/86. No change

## 2012-06-20 NOTE — Patient Instructions (Addendum)
Come back fasting for blood: FLP, CMP, CBC, TSH, PSA--- dx v70 Check the  blood pressure 2 or 3 times a week, be sure it is between 110/60 and 135/85. If it is consistently higher or lower, let me know

## 2012-06-23 ENCOUNTER — Encounter: Payer: Self-pay | Admitting: Internal Medicine

## 2012-06-24 ENCOUNTER — Other Ambulatory Visit (INDEPENDENT_AMBULATORY_CARE_PROVIDER_SITE_OTHER): Payer: 59

## 2012-06-24 DIAGNOSIS — Z0000 Encounter for general adult medical examination without abnormal findings: Secondary | ICD-10-CM

## 2012-06-24 LAB — COMPREHENSIVE METABOLIC PANEL
ALT: 60 U/L — ABNORMAL HIGH (ref 0–53)
BUN: 20 mg/dL (ref 6–23)
CO2: 27 mEq/L (ref 19–32)
Calcium: 9.5 mg/dL (ref 8.4–10.5)
Chloride: 102 mEq/L (ref 96–112)
Creatinine, Ser: 1 mg/dL (ref 0.4–1.5)
GFR: 87.04 mL/min (ref >60.00)
Glucose, Bld: 93 mg/dL (ref 70–99)

## 2012-06-24 LAB — CBC WITH DIFFERENTIAL/PLATELET
Basophils Absolute: 0 10*3/uL (ref 0.0–0.1)
Hemoglobin: 15.4 g/dL (ref 13.0–17.0)
Lymphocytes Relative: 21.5 % (ref 12.0–46.0)
Monocytes Relative: 9.2 % (ref 3.0–12.0)
Neutro Abs: 4.1 10*3/uL (ref 1.4–7.7)
Platelets: 204 10*3/uL (ref 150.0–400.0)
RDW: 12.8 % (ref 11.5–14.6)
WBC: 6.1 10*3/uL (ref 4.5–10.5)

## 2012-06-24 LAB — LIPID PANEL
Cholesterol: 224 mg/dL — ABNORMAL HIGH (ref 0–200)
Total CHOL/HDL Ratio: 6

## 2012-06-24 LAB — TSH: TSH: 0.96 u[IU]/mL (ref 0.35–5.50)

## 2012-06-24 LAB — LDL CHOLESTEROL, DIRECT: Direct LDL: 154.9 mg/dL

## 2012-06-27 ENCOUNTER — Encounter: Payer: Self-pay | Admitting: *Deleted

## 2012-07-10 ENCOUNTER — Ambulatory Visit (AMBULATORY_SURGERY_CENTER): Payer: 59 | Admitting: *Deleted

## 2012-07-10 ENCOUNTER — Encounter: Payer: Self-pay | Admitting: Internal Medicine

## 2012-07-10 VITALS — Ht 75.0 in | Wt 194.0 lb

## 2012-07-10 DIAGNOSIS — Z1211 Encounter for screening for malignant neoplasm of colon: Secondary | ICD-10-CM

## 2012-07-10 MED ORDER — MOVIPREP 100 G PO SOLR: 1.0000 | Freq: Once | ORAL | Status: DC

## 2012-07-24 ENCOUNTER — Encounter: Payer: Self-pay | Admitting: Internal Medicine

## 2012-07-24 ENCOUNTER — Ambulatory Visit (AMBULATORY_SURGERY_CENTER): Payer: 59 | Admitting: Internal Medicine

## 2012-07-24 VITALS — BP 123/82 | HR 66 | Temp 97.2°F | Resp 30 | Ht 75.0 in | Wt 194.0 lb

## 2012-07-24 DIAGNOSIS — D126 Benign neoplasm of colon, unspecified: Secondary | ICD-10-CM

## 2012-07-24 DIAGNOSIS — Z1211 Encounter for screening for malignant neoplasm of colon: Secondary | ICD-10-CM

## 2012-07-24 MED ORDER — SODIUM CHLORIDE 0.9 % IV SOLN: 500.0000 mL | INTRAVENOUS | Status: DC

## 2012-07-24 NOTE — Progress Notes (Signed)
Lidocaine-40mg IV prior to Propofol InductionPropofol given over incremental dosages 

## 2012-07-24 NOTE — Op Note (Signed)
[facility] [address] [place], [address]   COLONOSCOPY PROCEDURE REPORT  PATIENT: Gary Curry, Gary Curry  MR#: [id number] BIRTHDATE: [birth year] , 51  yrs. old GENDER: Male ENDOSCOPIST: Roxy Cedar, MD REFERRED NW:GNFA Drue Novel, M.D. PROCEDURE DATE:  07/24/2012 PROCEDURE:   Colonoscopy with snare polypectomy   x 2 ASA CLASS:   Class II INDICATIONS:average risk screening. MEDICATIONS: MAC sedation, administered by CRNA and propofol (Diprivan) 250mg  IV  DESCRIPTION OF PROCEDURE:   After the risks benefits and alternatives of the procedure were thoroughly explained, informed consent was obtained.  A digital rectal exam revealed no abnormalities of the rectum.   The LB [id number]  endoscope was introduced through the anus and advanced to the cecum, which was identified by both the appendix and ileocecal valve. No adverse events experienced.   The quality of the prep was excellent, using MoviPrep  The instrument was then slowly withdrawn as the colon was fully examined.      COLON FINDINGS: Two diminutive polyps were found at the cecum and in the ascending colon.  A polypectomy was performed with a cold snare.  The resection was complete and the polyp tissue was retrieved from the cecal polyp 9ascending without meaninful tissue to submit - too small.   Moderate diverticulosis was noted in the transverse colon, descending colon, and sigmoid colon.   The colon mucosa was otherwise normal.  Retroflexed views revealed internal hemorrhoids. The time to cecum=1 minutes 59 seconds.  Withdrawal time=12 minutes 05 seconds.  The scope was withdrawn and the procedure completed. COMPLICATIONS: There were no complications.  ENDOSCOPIC IMPRESSION: 1.   Two diminutive polyps were found at the cecum and in the ascending colon; polypectomy was performed with a cold snare 2.   Moderate diverticulosis was noted in the transverse colon, descending colon, and sigmoid  colon 3.   The colon mucosa was otherwise normal  RECOMMENDATIONS: 1. Follow up colonoscopy in 5 years   eSigned:  Roxy Cedar, MD 07/24/2012 11:23 AM  cc: Willow Ora, MD and The Patient   PATIENT NAME:  [name], [name] MR#: [id number]

## 2012-07-24 NOTE — Progress Notes (Signed)
Patient did not experience any of the following events: a burn prior to discharge; a fall within the facility; wrong site/side/patient/procedure/implant event; or a hospital transfer or hospital admission upon discharge from the facility. (G8907) Patient did not have preoperative order for IV antibiotic SSI prophylaxis. (G8918)  

## 2012-07-24 NOTE — Patient Instructions (Addendum)

## 2012-07-24 NOTE — Progress Notes (Signed)
Called to room to assist during endoscopic procedure.  Patient ID and intended procedure confirmed with present staff. Received instructions for my participation in the procedure from the performing physician.  

## 2012-07-25 ENCOUNTER — Telehealth: Payer: Self-pay | Admitting: *Deleted

## 2012-07-25 NOTE — Telephone Encounter (Signed)
  Follow up Call-  Call back number 07/24/2012  Post procedure Call Back phone  # [phone]  Permission to leave phone message Yes     Patient questions:  Do you have a fever, pain , or abdominal swelling? no Pain Score  0 *  Have you tolerated food without any problems? yes  Have you been able to return to your normal activities? yes  Do you have any questions about your discharge instructions: Diet   no Medications  no Follow up visit  no  Do you have questions or concerns about your Care? no  Actions: * If pain score is 4 or above: No action needed, pain <4.

## 2012-07-29 ENCOUNTER — Encounter: Payer: Self-pay | Admitting: Internal Medicine

## 2012-10-26 ENCOUNTER — Telehealth: Payer: Self-pay | Admitting: Internal Medicine

## 2012-10-26 NOTE — Telephone Encounter (Signed)
Due for recheck of LFTS, please arrange: --LFTs --Hep BsAg , Anti hep Bs, Hep C Dx increased LFTs

## 2012-10-27 ENCOUNTER — Encounter: Payer: Self-pay | Admitting: *Deleted

## 2012-10-27 NOTE — Telephone Encounter (Signed)
Letter mailed to pt to make aware he is due for lab work.  Lab orders entered.

## 2012-11-14 ENCOUNTER — Other Ambulatory Visit (INDEPENDENT_AMBULATORY_CARE_PROVIDER_SITE_OTHER): Payer: 59

## 2012-11-14 DIAGNOSIS — R7989 Other specified abnormal findings of blood chemistry: Secondary | ICD-10-CM

## 2012-11-14 LAB — HEPATIC FUNCTION PANEL
Albumin: 4.1 g/dL (ref 3.5–5.2)
Total Protein: 7 g/dL (ref 6.0–8.3)

## 2012-11-18 LAB — HEPATITIS B SURFACE ANTIBODY,QUALITATIVE: Hep B S Ab: NONREACTIVE

## 2012-11-20 ENCOUNTER — Encounter: Payer: Self-pay | Admitting: *Deleted

## 2013-01-06 ENCOUNTER — Telehealth: Payer: Self-pay | Admitting: *Deleted

## 2013-01-06 NOTE — Telephone Encounter (Signed)
Is standard of care to check for hepatitis markers whenever a patient has elevated LFTs; I actually requested a hepatitis C marker but it  wasn't done, we'll need to check when he comes back.  Please write a letter (see below) if pt request: To Whom It May Concern Mr. Gary Curry  is a patient of mine, over time his LFTs have been elevated mildly on and off. It is the standard of care and medically necessary in this situation to check for hepatitis markers and indeed he still needs hepatitis C marker and may need further labs in the future  . If you need more information please let me know

## 2013-01-06 NOTE — Telephone Encounter (Signed)
Left message to call office

## 2013-01-06 NOTE — Telephone Encounter (Signed)
Pt left VM that his insurance notified him that the Hepatitis panel we perform on him was not covered. Pt would like to know why was this test performed and if it was medically necessary.Pt indicated that he may need a letter for insurance..Please advise

## 2013-01-07 NOTE — Telephone Encounter (Signed)
Discuss with patient letter faxed to Pt at [phone].

## 2013-06-05 ENCOUNTER — Telehealth: Payer: Self-pay

## 2013-06-05 NOTE — Telephone Encounter (Signed)
Left message for call back Non identifiable  Tdap--07/2004 [facility]--06/2012-Dr Perry--adenoma--next 2019 PSA--05/2012--2.55

## 2013-06-08 ENCOUNTER — Encounter: Payer: Self-pay | Admitting: Internal Medicine

## 2013-06-08 ENCOUNTER — Ambulatory Visit (INDEPENDENT_AMBULATORY_CARE_PROVIDER_SITE_OTHER): Payer: 59 | Admitting: Internal Medicine

## 2013-06-08 VITALS — BP 149/93 | HR 72 | Temp 98.0°F | Ht 75.0 in | Wt 201.0 lb

## 2013-06-08 DIAGNOSIS — R05 Cough: Secondary | ICD-10-CM

## 2013-06-08 DIAGNOSIS — Z0000 Encounter for general adult medical examination without abnormal findings: Secondary | ICD-10-CM

## 2013-06-08 DIAGNOSIS — I10 Essential (primary) hypertension: Secondary | ICD-10-CM

## 2013-06-08 DIAGNOSIS — R059 Cough, unspecified: Secondary | ICD-10-CM

## 2013-06-08 LAB — LIPID PANEL
Cholesterol: 229 mg/dL — ABNORMAL HIGH (ref 0–200)
Total CHOL/HDL Ratio: 6

## 2013-06-08 LAB — LDL CHOLESTEROL, DIRECT: Direct LDL: 156 mg/dL

## 2013-06-08 LAB — CBC WITH DIFFERENTIAL/PLATELET
Eosinophils Relative: 0.8 % (ref 0.0–5.0)
HCT: 46.4 % (ref 39.0–52.0)
Lymphs Abs: 1.4 10*3/uL (ref 0.7–4.0)
MCV: 87.5 fl (ref 78.0–100.0)
Monocytes Absolute: 0.5 10*3/uL (ref 0.1–1.0)
Platelets: 238 10*3/uL (ref 150.0–400.0)
RDW: 12.8 % (ref 11.5–14.6)

## 2013-06-08 LAB — COMPREHENSIVE METABOLIC PANEL
ALT: 61 U/L — ABNORMAL HIGH (ref 0–53)
AST: 35 U/L (ref 0–37)
Alkaline Phosphatase: 76 U/L (ref 39–117)
Sodium: 137 mEq/L (ref 135–145)
Total Bilirubin: 1 mg/dL (ref 0.3–1.2)
Total Protein: 7.1 g/dL (ref 6.0–8.3)

## 2013-06-08 LAB — PSA: PSA: 1.71 ng/mL (ref 0.10–4.00)

## 2013-06-08 MED ORDER — FLUTICASONE PROPIONATE 50 MCG/ACT NA SUSP: 2.0000 | Freq: Every day | NASAL | Status: DC

## 2013-06-08 NOTE — Progress Notes (Signed)
Pre visit review using our clinic review tool, if applicable. No additional management support is needed unless otherwise documented below in the visit note. 

## 2013-06-08 NOTE — Assessment & Plan Note (Addendum)
Today we also discussed the following: Several years history of cough even before he started lisinopril. Also postnasal dripping, usually early in the morning, symptoms clear later on the day. Denies sneezing, itchy eyes or itchy nose. On exam tympanic membranes are normal. Nose is slightly congested. Plan: Flonase , to be used consistently Reassess in 6 months, if not better consider a trial without lisinopril

## 2013-06-08 NOTE — Assessment & Plan Note (Addendum)
Td 2006 zostavax-- discussed had a  flu shot    Cscope 06-2012 2 polyps, next in 5 years encourage to exercise more , discussed diet Labs including vit d and testosterone  History of mild increased LFTs, hepatitis B serology was negative.

## 2013-06-08 NOTE — Patient Instructions (Addendum)
Get your blood work before you leave  Next visit for a follow up, no  fasting, in 6 months Please make an appointment     Check the  blood pressure 2 or 3 times a month  be sure it is between 110/60 and 140/85. Ideal blood pressure is 120/80. If it is consistently higher or lower, let me know   Let me know if the cough is not improving with consistent use of Flonase

## 2013-06-08 NOTE — Progress Notes (Signed)
   Subjective:    Patient ID: Gary Curry, male    DOB: 20-Aug-1960, 52 y.o.   MRN: [id number]  HPI CPX  Past Medical History  Diagnosis Date  . Hyperlipidemia   . Hypertension   . MVP (mitral valve prolapse)     question of.., dx in the 90s  . Basal cell carcinoma 12 years ago  . H/O hypogonadism     sees Dr Talmage Nap for low testosterone, problem self resolve (2011) discharge from endocrine   Past Surgical History  Procedure Laterality Date  . Back surgery  2000/2002  . Varicocele excision  2011   History   Social History  . Marital Status: Married    Spouse Name: N/A    Number of Children: 0  . Years of Education: N/A   Occupational History  . 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 Smoking status: Never Smoker   . Smokeless tobacco: Never Used  . Alcohol Use: Yes     Comment: rarely   . Drug Use: No  . Sexual Activity: Not on file   Other Topics Concern  . Not on file   Social History Narrative  . No narrative on file     Family History: Father:SCC of skin, arrythmia DM--no Strokes-- GF High chol-- M Arrhythmia-- twin brother  on coumadin    MI --GF 41 y/o colon Ca-- GF age early 19s   prostate cancer--no   Review of Systems Exercise-- not regularly   diet-- does well most of the time No  CP, SOB, lower extremity edema Denies  nausea, vomiting diarrhea Denies  blood in the stools No dysuria, gross hematuria, difficulty urinating   No anxiety, depression        Objective:   Physical Exam BP 149/93  Pulse 72  Temp(Src) 98 F (36.7 C)  Ht 6\' 3"  (1.905 m)  Wt 201 lb (91.173 kg)  BMI 25.12 kg/m2  SpO2 99% General -- alert, well-developed, NAD.  Neck --no thyromegaly , normal carotid pulse Lungs -- normal respiratory effort, no intercostal retractions, no accessory muscle use, and normal breath sounds.  Heart-- normal rate, regular rhythm, no murmur.  Abdomen-- Not distended, good bowel sounds,soft,  non-tender. Rectal-- No external abnormalities noted. Normal sphincter tone. No rectal masses or tenderness. no stools found Prostate--Prostate gland firm and smooth, no enlargement, nodularity, tenderness, mass, asymmetry or induration. Extremities-- no pretibial edema bilaterally  Neurologic--  alert & oriented X3. Speech normal, gait normal, strength normal in all extremities.  Psych-- Cognition and judgment appear intact. Cooperative with normal attention span and concentration. No anxious appearing , no depressed appearing.         Assessment & Plan:

## 2013-06-08 NOTE — Assessment & Plan Note (Addendum)
Good edication compliance, ambulatory BPs within normal, BP today slightly elevated (checked x 2 ), recommend no change and monitor BPs as an outpatient

## 2013-06-08 NOTE — Telephone Encounter (Signed)
Unable to reach pre visit.  

## 2013-06-09 LAB — VITAMIN D 25 HYDROXY (VIT D DEFICIENCY, FRACTURES): Vit D, 25-Hydroxy: 35 ng/mL (ref 30–89)

## 2013-06-09 LAB — TESTOSTERONE, FREE, TOTAL, SHBG
Sex Hormone Binding: 18 nmol/L (ref 13–71)
Testosterone, Free: 74.4 pg/mL (ref 47.0–244.0)
Testosterone-% Free: 2.6 % (ref 1.6–2.9)

## 2013-06-11 ENCOUNTER — Encounter: Payer: Self-pay | Admitting: *Deleted

## 2013-06-30 ENCOUNTER — Encounter: Payer: 59 | Admitting: Internal Medicine

## 2013-07-14 ENCOUNTER — Other Ambulatory Visit: Payer: Self-pay | Admitting: *Deleted

## 2013-07-14 MED ORDER — LISINOPRIL 20 MG PO TABS: 20.0000 mg | ORAL_TABLET | Freq: Every day | ORAL | Status: DC

## 2013-11-30 ENCOUNTER — Encounter: Payer: Self-pay | Admitting: Internal Medicine

## 2013-11-30 ENCOUNTER — Ambulatory Visit (INDEPENDENT_AMBULATORY_CARE_PROVIDER_SITE_OTHER): Payer: 59 | Admitting: Internal Medicine

## 2013-11-30 VITALS — BP 152/96 | HR 77 | Temp 97.9°F | Wt 203.0 lb

## 2013-11-30 DIAGNOSIS — R059 Cough, unspecified: Secondary | ICD-10-CM

## 2013-11-30 DIAGNOSIS — R05 Cough: Secondary | ICD-10-CM

## 2013-11-30 DIAGNOSIS — I10 Essential (primary) hypertension: Secondary | ICD-10-CM

## 2013-11-30 MED ORDER — LOSARTAN POTASSIUM 100 MG PO TABS
100.0000 mg | ORAL_TABLET | Freq: Every day | ORAL | Status: DC
Start: 2013-11-30 — End: 2014-01-07

## 2013-11-30 NOTE — Assessment & Plan Note (Signed)
BP not as well-controlled as we would like to, ambulatory BPs 140/90. Additionally,  Has chronic cough. Plan: Discontinue lisinopril 20 mg, start losartan 100 mg, see instructions

## 2013-11-30 NOTE — Progress Notes (Signed)
   Subjective:    Patient ID: Gary Curry, male    DOB: 09-09-1960, 53 y.o.   MRN: [id number]  DOS:  11/30/2013 Type of  Visit: Followup from previous visit History: Patient has chronic cough, He is taking consistently Flonase and Zyrtec, cough has greatly decreased, he still has some episodes of cough, he is not 100% better. Ambulatory BPs usually 140/90  ROS Denies chest pain, difficulty breathing or lower extremity edema No nausea, vomiting, GERD symptoms  Past Medical History  Diagnosis Date  . Hyperlipidemia   . Hypertension   . MVP (mitral valve prolapse)     question of.., dx in the 90s  . Basal cell carcinoma 12 years ago  . H/O hypogonadism     sees Dr Chalmers Cater for low testosterone, problem self resolve (2011) discharge from endocrine    Past Surgical History  Procedure Laterality Date  . Back surgery  2000/2002  . Varicocele excision  2011    History   Social History  . Marital Status: Married    Spouse Name: N/A    Number of Children: 0  . Years of Education: N/A   Occupational History  . 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 Smoking status: Never Smoker   . Smokeless tobacco: Never Used  . Alcohol Use: Yes     Comment: rarely   . Drug Use: No  . Sexual Activity: Not on file   Other Topics Concern  . Not on file   Social History Narrative  . No narrative on file        Medication List       This list is accurate as of: 11/30/13  2:57 PM.  Always use your most recent med list.               cetirizine 10 MG tablet  Commonly known as:  ZYRTEC  Take 10 mg by mouth daily.     fluticasone 50 MCG/ACT nasal spray  Commonly known as:  FLONASE  Place 2 sprays into both nostrils daily.     losartan 100 MG tablet  Commonly known as:  COZAAR  Take 1 tablet (100 mg total) by mouth daily.     multivitamin capsule  Take 1 capsule by mouth daily.     Vitamin D3 3000 UNITS Tabs  Take 1 tablet by mouth daily.             Objective:   Physical Exam BP 152/96  Pulse 77  Temp(Src) 97.9 F (36.6 C)  Wt 203 lb (92.08 kg)  SpO2 99%  General -- alert, well-developed, NAD.  Lungs -- normal respiratory effort, no intercostal retractions, no accessory muscle use, and normal breath sounds.  Heart-- normal rate, regular rhythm, no murmur.  Extremities-- no pretibial edema bilaterally  Neurologic--  alert & oriented X3. Speech normal, gait appropriate for age, strength symmetric and appropriate for age.  Psych-- Cognition and judgment appear intact. Cooperative with normal attention span and concentration. No anxious or depressed appearing.         Assessment & Plan:

## 2013-11-30 NOTE — Progress Notes (Signed)
Pre visit review using our clinic review tool, if applicable. No additional management support is needed unless otherwise documented below in the visit note. 

## 2013-11-30 NOTE — Patient Instructions (Addendum)
Start losartan  Check the  blood pressure 2 or 3 times a   week be sure it is between 110/60 and 140/85. Ideal blood pressure is 120/80. If it is consistently higher or lower, let me know   Please schedule labs (BMP dx HTN) 3 weeks from now   Get your blood work before you leave   Next visit is for a physical exam by 05-2014, fasting Please make an appointment

## 2013-11-30 NOTE — Assessment & Plan Note (Addendum)
Patient is using consistently flonase- Zyrtec, cough has significantly decreased but he still has episodes of cough. I think his cough is multifactorial, likely d/t allergies and related to lisinopril. Plan: Continue treatment for allergies and change of lisinopril. see hypertension

## 2013-12-01 ENCOUNTER — Telehealth: Payer: Self-pay | Admitting: Internal Medicine

## 2013-12-01 NOTE — Telephone Encounter (Signed)
Relevant patient education mailed to patient.  

## 2013-12-07 ENCOUNTER — Ambulatory Visit: Payer: 59 | Admitting: Internal Medicine

## 2013-12-21 ENCOUNTER — Other Ambulatory Visit (INDEPENDENT_AMBULATORY_CARE_PROVIDER_SITE_OTHER): Payer: 59

## 2013-12-21 DIAGNOSIS — E785 Hyperlipidemia, unspecified: Secondary | ICD-10-CM

## 2013-12-23 LAB — LIPID PANEL
CHOLESTEROL: 202 mg/dL — AB (ref 0–200)
HDL: 36.3 mg/dL — ABNORMAL LOW (ref >39.00)
LDL CALC: 127 mg/dL — AB (ref 0–99)
NonHDL: 165.7
Total CHOL/HDL Ratio: 6
Triglycerides: 194 mg/dL — ABNORMAL HIGH (ref 0.0–149.0)
VLDL: 38.8 mg/dL (ref 0.0–40.0)

## 2013-12-23 LAB — HEPATIC FUNCTION PANEL
ALT: 50 U/L (ref 0–53)
AST: 35 U/L (ref 0–37)
Albumin: 4.3 g/dL (ref 3.5–5.2)
Alkaline Phosphatase: 58 U/L (ref 39–117)
Bilirubin, Direct: 0.1 mg/dL (ref 0.0–0.3)
TOTAL PROTEIN: 7 g/dL (ref 6.0–8.3)
Total Bilirubin: 0.8 mg/dL (ref 0.2–1.2)

## 2013-12-29 ENCOUNTER — Other Ambulatory Visit: Payer: Self-pay

## 2013-12-29 DIAGNOSIS — I10 Essential (primary) hypertension: Secondary | ICD-10-CM

## 2014-01-05 ENCOUNTER — Other Ambulatory Visit (INDEPENDENT_AMBULATORY_CARE_PROVIDER_SITE_OTHER): Payer: 59

## 2014-01-05 DIAGNOSIS — I10 Essential (primary) hypertension: Secondary | ICD-10-CM

## 2014-01-05 LAB — BASIC METABOLIC PANEL
BUN: 17 mg/dL (ref 6–23)
CALCIUM: 9.4 mg/dL (ref 8.4–10.5)
CO2: 25 meq/L (ref 19–32)
CREATININE: 1 mg/dL (ref 0.4–1.5)
Chloride: 104 mEq/L (ref 96–112)
GFR: 84.49 mL/min (ref >60.00)
GLUCOSE: 89 mg/dL (ref 70–99)
Potassium: 3.8 mEq/L (ref 3.5–5.1)
Sodium: 137 mEq/L (ref 135–145)

## 2014-01-07 MED ORDER — LOSARTAN POTASSIUM 100 MG PO TABS: 100.0000 mg | ORAL_TABLET | Freq: Every day | ORAL | Status: DC

## 2014-04-14 ENCOUNTER — Other Ambulatory Visit: Payer: Self-pay | Admitting: Dermatology

## 2014-06-21 ENCOUNTER — Ambulatory Visit (INDEPENDENT_AMBULATORY_CARE_PROVIDER_SITE_OTHER): Payer: 59 | Admitting: Internal Medicine

## 2014-06-21 ENCOUNTER — Encounter: Payer: Self-pay | Admitting: Internal Medicine

## 2014-06-21 VITALS — BP 167/92 | HR 69 | Temp 98.1°F | Ht 75.0 in | Wt 202.0 lb

## 2014-06-21 DIAGNOSIS — E291 Testicular hypofunction: Secondary | ICD-10-CM

## 2014-06-21 DIAGNOSIS — R059 Cough, unspecified: Secondary | ICD-10-CM

## 2014-06-21 DIAGNOSIS — R05 Cough: Secondary | ICD-10-CM

## 2014-06-21 DIAGNOSIS — Z0000 Encounter for general adult medical examination without abnormal findings: Secondary | ICD-10-CM

## 2014-06-21 DIAGNOSIS — I10 Essential (primary) hypertension: Secondary | ICD-10-CM

## 2014-06-21 LAB — LIPID PANEL
CHOL/HDL RATIO: 5
Cholesterol: 191 mg/dL (ref 0–200)
HDL: 35.1 mg/dL — AB (ref >39.00)
LDL Cholesterol: 117 mg/dL — ABNORMAL HIGH (ref 0–99)
NONHDL: 155.9
TRIGLYCERIDES: 197 mg/dL — AB (ref 0.0–149.0)
VLDL: 39.4 mg/dL (ref 0.0–40.0)

## 2014-06-21 LAB — BASIC METABOLIC PANEL
BUN: 20 mg/dL (ref 6–23)
CHLORIDE: 106 meq/L (ref 96–112)
CO2: 28 mEq/L (ref 19–32)
Calcium: 9.1 mg/dL (ref 8.4–10.5)
Creatinine, Ser: 1 mg/dL (ref 0.4–1.5)
GFR: 85.34 mL/min (ref >60.00)
Glucose, Bld: 90 mg/dL (ref 70–99)
POTASSIUM: 4.2 meq/L (ref 3.5–5.1)
SODIUM: 139 meq/L (ref 135–145)

## 2014-06-21 LAB — AST: AST: 46 U/L — ABNORMAL HIGH (ref 0–37)

## 2014-06-21 LAB — ALT: ALT: 49 U/L (ref 0–53)

## 2014-06-21 NOTE — Assessment & Plan Note (Addendum)
Td 2006 zostavax-- discussed before  had a  flu shot     Cscope 06-2012 --- 2 polyps, next in 5 years Diet is about the same as last year, is trying to exercise more, taking walks Labs  History of mild increased LFTs, hepatitis B serology was negative, check hep C serology  ekg-- nsr

## 2014-06-21 NOTE — Assessment & Plan Note (Addendum)
Feeling great taking losartan, BP today was a slightly elevated, it was rechecked and is 130/80. Great ambulatory BPs Plan: Continue losartan

## 2014-06-21 NOTE — Patient Instructions (Signed)
Get your blood work before you leave   Check the  blood pressure 2 or 3 times a month  Be sure your blood pressure is between  145/85  and 110/65.  if it is consistently higher or lower, let me know  Please come back to the office in 1 year  for a physical exam (as long as your BP is ok). Come back fasting

## 2014-06-21 NOTE — Progress Notes (Signed)
Pre visit review using our clinic review tool, if applicable. No additional management support is needed unless otherwise documented below in the visit note. 

## 2014-06-21 NOTE — Progress Notes (Signed)
Subjective:    Patient ID: Gary Curry, male    DOB: 1961-06-17, 53 y.o.   MRN: [id number]  DOS:  06/21/2014 Type of visit - description : cpx Interval history, feeling well, ambulatory BPs are very good.   ROS Denies chest pain or difficulty breathing No nausea, vomiting, diarrhea or blood in the stools. Had some cough, well-controlled on Zyrtec No dysuria, gross hematuria, difficulty urinating or urinary frequency  Past Medical History  Diagnosis Date  . Hyperlipidemia   . Hypertension   . MVP (mitral valve prolapse)     question of.., dx in the 90s  . Basal cell carcinoma 12 years ago    sees derm Dr Martinique  . H/O hypogonadism     used to see Dr Chalmers Cater    Past Surgical History  Procedure Laterality Date  . Back surgery  2000/2002  . Varicocele excision  2011    History   Social History  . Marital Status: Married    Spouse Name: [name]    Number of Children: 0  . Years of Education: N/A   Occupational History  . 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 Smoking status: Never Smoker   . Smokeless tobacco: Never Used  . Alcohol Use: Yes     Comment: rarely   . Drug Use: No  . Sexual Activity: Not on file   Other Topics Concern  . Not on file   Social History Narrative     Family History  Problem Relation Age of Onset  . Hyperlipidemia Mother   . Heart disease Other     GF  . Prostate cancer Neg Hx   . Colon cancer Paternal Grandfather   . Esophageal cancer Neg Hx   . Rectal cancer Neg Hx   . Stomach cancer Neg Hx        Medication List       This list is accurate as of: 06/21/14  6:51 PM.  Always use your most recent med list.               cetirizine 10 MG tablet  Commonly known as:  ZYRTEC  Take 10 mg by mouth daily as needed.     fluticasone 50 MCG/ACT nasal spray  Commonly known as:  FLONASE  Place 2 sprays into both nostrils daily.     losartan 100 MG tablet  Commonly known as:  COZAAR    Take 1 tablet (100 mg total) by mouth daily.     multivitamin capsule  Take 1 capsule by mouth daily.     Vitamin D3 3000 UNITS Tabs  Take 1 tablet by mouth daily.           Objective:   Physical Exam BP 167/92 mmHg  Pulse 69  Temp(Src) 98.1 F (36.7 C) (Oral)  Ht 6\' 3"  (1.905 m)  Wt 202 lb (91.627 kg)  BMI 25.25 kg/m2  SpO2 100% BP recheck: 130/80 General -- alert, well-developed, NAD.  Neck --no thyromegaly  HEENT-- Not pale.  Lungs -- normal respiratory effort, no intercostal retractions, no accessory muscle use, and normal breath sounds.  Heart-- normal rate, regular rhythm, no murmur.  Abdomen-- Not distended, good bowel sounds,soft, non-tender.  Rectal-- No external abnormalities noted. Normal sphincter tone. No rectal masses or tenderness. No stool    Prostate--Prostate gland firm and smooth, no enlargement, nodularity, tenderness, mass, asymmetry or induration. Extremities-- no pretibial edema bilaterally  Neurologic--  alert &  oriented X3. Speech normal, gait appropriate for age, strength symmetric and appropriate for age.  Psych-- Cognition and judgment appear intact. Cooperative with normal attention span and concentration. No anxious or depressed appearing.       Assessment & Plan:

## 2014-06-21 NOTE — Assessment & Plan Note (Signed)
Improved with Zyrtec as needed

## 2014-06-22 LAB — TESTOSTERONE, FREE, TOTAL, SHBG
SEX HORMONE BINDING: 16 nmol/L (ref 13–71)
TESTOSTERONE: 219 ng/dL — AB (ref 300–890)
Testosterone, Free: 59.6 pg/mL (ref 47.0–244.0)
Testosterone-% Free: 2.7 % (ref 1.6–2.9)

## 2014-06-22 LAB — HEPATITIS C ANTIBODY: HCV Ab: NEGATIVE

## 2014-06-23 ENCOUNTER — Encounter: Payer: 59 | Admitting: Internal Medicine

## 2014-08-10 ENCOUNTER — Other Ambulatory Visit: Payer: Self-pay | Admitting: Internal Medicine

## 2014-10-11 ENCOUNTER — Ambulatory Visit (INDEPENDENT_AMBULATORY_CARE_PROVIDER_SITE_OTHER): Payer: BLUE CROSS/BLUE SHIELD | Admitting: Internal Medicine

## 2014-10-11 ENCOUNTER — Encounter: Payer: Self-pay | Admitting: Internal Medicine

## 2014-10-11 VITALS — BP 136/84 | HR 79 | Temp 98.0°F | Ht 75.0 in | Wt 200.5 lb

## 2014-10-11 DIAGNOSIS — I10 Essential (primary) hypertension: Secondary | ICD-10-CM | POA: Diagnosis not present

## 2014-10-11 MED ORDER — AMLODIPINE BESYLATE 5 MG PO TABS: 5.0000 mg | ORAL_TABLET | Freq: Every day | ORAL | Status: DC

## 2014-10-11 NOTE — Patient Instructions (Signed)
Add amlodipine  If you have any side effects or you have more headaches please let us know  Check the  blood pressure weekly  Be sure your blood pressure is between 110/65 and  145/85.  if it is consistently higher or lower, let me know    Otherwise will see you  in December for a physical exam

## 2014-10-11 NOTE — Progress Notes (Signed)
Pre visit review using our clinic review tool, if applicable. No additional management support is needed unless otherwise documented below in the visit note. 

## 2014-10-11 NOTE — Assessment & Plan Note (Addendum)
Patient reports good compliance w/ losartan but BP has been elevated lately. He also had mild headache which is now resolved, unclear if the headache was related to the elevated blood pressure or something  else. Plan: Continue with healthy lifestyle, add amlodipine, continue monitoring BPs. See instruction. If the headache resurface, he is to let me know.

## 2014-10-11 NOTE — Progress Notes (Signed)
Subjective:    Patient ID: Gary Curry, male    DOB: [birth year], 54 y.o.   MRN: [id number]  DOS:  10/11/2014 Type of visit - description : acute Interval history: The patient check his BPs monthly, about 10 days ago not to his BP to be 150/95, at the same time he had a mild but persisting headache mostly at the nuchal area; he felt that HA was from "dehydration". He started drinking more fluids and checking his BP daily. BP is staying in the 150/90 range, occasionally in the 130s. His headaches quickly went away. Currently asymptomatic   Review of Systems Taking Zyrtec but not the one with a decongestant. Not taking motrin or  similar nsaids Eats very healthy including low salt Some stress but not severe Denies nausea, vomiting. During the summer was more physically active, more sedentary now.  Past Medical History  Diagnosis Date  . Hyperlipidemia   . Hypertension   . MVP (mitral valve prolapse)     question of.., dx in the 90s  . Basal cell carcinoma 12 years ago    sees derm Dr Martinique  . H/O hypogonadism     used to see Dr Chalmers Cater    Past Surgical History  Procedure Laterality Date  . Back surgery  2000/2002  . Varicocele excision  2011    History   Social History  . Marital Status: Married    Spouse Name: [name]  . Number of Children: 0  . Years of Education: N/A   Occupational History  . 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 Smoking status: Never Smoker   . Smokeless tobacco: Never Used  . Alcohol Use: Yes     Comment: rarely   . Drug Use: No  . Sexual Activity: Not on file   Other Topics Concern  . Not on file   Social History Narrative        Medication List       This list is accurate as of: 10/11/14 11:59 PM.  Always use your most recent med list.               amLODipine 5 MG tablet  Commonly known as:  NORVASC  Take 1 tablet (5 mg total) by mouth daily.     cetirizine 10 MG tablet  Commonly  known as:  ZYRTEC  Take 10 mg by mouth daily as needed.     fluticasone 50 MCG/ACT nasal spray  Commonly known as:  FLONASE  USE 2 SPRAYS IN EACH NOSTRIL ONCE A DAY.     losartan 100 MG tablet  Commonly known as:  COZAAR  Take 1 tablet (100 mg total) by mouth daily.     multivitamin capsule  Take 1 capsule by mouth daily.     Vitamin D3 3000 UNITS Tabs  Take 1 tablet by mouth daily.           Objective:   Physical Exam BP 136/84 mmHg  Pulse 79  Temp(Src) 98 F (36.7 C) (Oral)  Ht 6\' 3"  (1.905 m)  Wt 200 lb 8 oz (90.946 kg)  BMI 25.06 kg/m2  SpO2 97% General:   Well developed, well nourished . NAD.  HEENT:  Normocephalic . Face symmetric, atraumatic Lungs:  CTA B Normal respiratory effort, no intercostal retractions, no accessory muscle use. Heart: RRR,  no murmur.  Muscle skeletal: no pretibial edema bilaterally  Skin: Not pale. Not jaundice Neurologic:  alert & oriented  X3.  Speech normal, gait appropriate for age and unassisted Psych--  Cognition and judgment appear intact.  Cooperative with normal attention span and concentration.  Behavior appropriate. No anxious or depressed appearing.       Assessment & Plan:

## 2014-12-02 ENCOUNTER — Other Ambulatory Visit: Payer: Self-pay | Admitting: Internal Medicine

## 2015-02-15 ENCOUNTER — Other Ambulatory Visit: Payer: Self-pay | Admitting: Internal Medicine

## 2015-06-22 ENCOUNTER — Telehealth: Payer: Self-pay | Admitting: Behavioral Health

## 2015-06-22 NOTE — Telephone Encounter (Signed)
Unable to reach patient at time of Pre-Visit Call.  Left message for patient to return call when available.    

## 2015-06-23 ENCOUNTER — Ambulatory Visit (INDEPENDENT_AMBULATORY_CARE_PROVIDER_SITE_OTHER): Payer: BLUE CROSS/BLUE SHIELD | Admitting: Internal Medicine

## 2015-06-23 ENCOUNTER — Encounter: Payer: Self-pay | Admitting: Internal Medicine

## 2015-06-23 VITALS — BP 122/80 | HR 74 | Temp 98.0°F | Ht 75.0 in | Wt 201.5 lb

## 2015-06-23 DIAGNOSIS — Z125 Encounter for screening for malignant neoplasm of prostate: Secondary | ICD-10-CM | POA: Diagnosis not present

## 2015-06-23 DIAGNOSIS — E291 Testicular hypofunction: Secondary | ICD-10-CM

## 2015-06-23 DIAGNOSIS — Z0000 Encounter for general adult medical examination without abnormal findings: Secondary | ICD-10-CM | POA: Diagnosis not present

## 2015-06-23 DIAGNOSIS — Z114 Encounter for screening for human immunodeficiency virus [HIV]: Secondary | ICD-10-CM

## 2015-06-23 DIAGNOSIS — Z09 Encounter for follow-up examination after completed treatment for conditions other than malignant neoplasm: Secondary | ICD-10-CM | POA: Insufficient documentation

## 2015-06-23 DIAGNOSIS — Z23 Encounter for immunization: Secondary | ICD-10-CM

## 2015-06-23 LAB — COMPREHENSIVE METABOLIC PANEL
ALK PHOS: 81 U/L (ref 39–117)
ALT: 44 U/L (ref 0–53)
AST: 25 U/L (ref 0–37)
Albumin: 4.5 g/dL (ref 3.5–5.2)
BILIRUBIN TOTAL: 0.8 mg/dL (ref 0.2–1.2)
BUN: 19 mg/dL (ref 6–23)
CO2: 30 mEq/L (ref 19–32)
Calcium: 9.8 mg/dL (ref 8.4–10.5)
Chloride: 104 mEq/L (ref 96–112)
Creatinine, Ser: 0.98 mg/dL (ref 0.40–1.50)
GFR: 85.01 mL/min (ref >60.00)
Glucose, Bld: 93 mg/dL (ref 70–99)
POTASSIUM: 4.4 meq/L (ref 3.5–5.1)
Sodium: 139 mEq/L (ref 135–145)
TOTAL PROTEIN: 7.1 g/dL (ref 6.0–8.3)

## 2015-06-23 LAB — LIPID PANEL
Cholesterol: 227 mg/dL — ABNORMAL HIGH (ref 0–200)
HDL: 38.1 mg/dL — AB (ref >39.00)
LDL Cholesterol: 149 mg/dL — ABNORMAL HIGH (ref 0–99)
NonHDL: 188.99
Total CHOL/HDL Ratio: 6
Triglycerides: 198 mg/dL — ABNORMAL HIGH (ref 0.0–149.0)
VLDL: 39.6 mg/dL (ref 0.0–40.0)

## 2015-06-23 LAB — CBC WITH DIFFERENTIAL/PLATELET
Basophils Absolute: 0.1 10*3/uL (ref 0.0–0.1)
Basophils Relative: 0.7 % (ref 0.0–3.0)
EOS PCT: 2.3 % (ref 0.0–5.0)
Eosinophils Absolute: 0.2 10*3/uL (ref 0.0–0.7)
HCT: 47 % (ref 39.0–52.0)
HEMOGLOBIN: 15.5 g/dL (ref 13.0–17.0)
Lymphocytes Relative: 22.4 % (ref 12.0–46.0)
Lymphs Abs: 1.8 10*3/uL (ref 0.7–4.0)
MCHC: 33 g/dL (ref 30.0–36.0)
MCV: 89.4 fl (ref 78.0–100.0)
MONOS PCT: 6 % (ref 3.0–12.0)
Monocytes Absolute: 0.5 10*3/uL (ref 0.1–1.0)
Neutro Abs: 5.6 10*3/uL (ref 1.4–7.7)
Neutrophils Relative %: 68.6 % (ref 43.0–77.0)
Platelets: 248 10*3/uL (ref 150.0–400.0)
RBC: 5.26 Mil/uL (ref 4.22–5.81)
RDW: 12.9 % (ref 11.5–15.5)
WBC: 8.1 10*3/uL (ref 4.0–10.5)

## 2015-06-23 LAB — PSA: PSA: 1.92 ng/mL (ref 0.10–4.00)

## 2015-06-23 NOTE — Progress Notes (Signed)
Subjective:    Patient ID: Gary Curry, male    DOB: 1961/02/04, 54 y.o.   MRN: [id number]  DOS:  06/23/2015 Type of visit - description : CPX Interval history: No concerns    Review of Systems Constitutional: No fever. No chills. No unexplained wt changes. No unusual sweats  HEENT: No dental problems, no ear discharge, no facial swelling, no voice changes. No eye discharge, no eye  redness , no  intolerance to light   Respiratory: No wheezing , no  difficulty breathing. No cough , no mucus production  Cardiovascular: No CP, no leg swelling , no  Palpitations  GI: no nausea, no vomiting, no diarrhea , no  abdominal pain.  No blood in the stools. No dysphagia, no odynophagia    Endocrine: No polyphagia, no polyuria , no polydipsia  GU: No dysuria, gross hematuria, difficulty urinating. No urinary urgency, no frequency.  Musculoskeletal: No joint swellings or unusual aches or pains  Skin: No change in the color of the skin, palor , no  Rash  Allergic, immunologic: No environmental allergies , no  food allergies  Neurological: No dizziness no  syncope. No headaches. No diplopia, no slurred, no slurred speech, no motor deficits, no facial  Numbness  Hematological: No enlarged lymph nodes, no easy bruising , no unusual bleedings  Psychiatry: No suicidal ideas, no hallucinations, no beavior problems, no confusion.  No unusual/severe anxiety, no depression   Past Medical History  Diagnosis Date  . Hyperlipidemia   . Hypertension   . MVP (mitral valve prolapse)     question of.., dx in the 90s  . Basal cell carcinoma 12 years ago    sees derm Dr Martinique  . H/O hypogonadism     used to see Dr Chalmers Cater    Past Surgical History  Procedure Laterality Date  . Back surgery  2000/2002  . Varicocele excision  2011    Social History   Social History  . Marital Status: Married    Spouse Name: [name]  . Number of Children: 0  . Years of Education: N/A   Occupational  History  . 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 Smoking status: Never Smoker   . Smokeless tobacco: Never Used  . Alcohol Use: Yes     Comment: rarely   . Drug Use: No  . Sexual Activity: Not on file   Other Topics Concern  . Not on file   Social History Narrative   Household-- pt and wife     Family History  Problem Relation Age of Onset  . Hyperlipidemia Mother   . Heart disease Other     GF  . Prostate cancer Neg Hx   . Colon cancer Paternal Grandfather   . Esophageal cancer Neg Hx   . Rectal cancer Neg Hx   . Stomach cancer Neg Hx         Medication List       This list is accurate as of: 06/23/15  6:02 PM.  Always use your most recent med list.               amLODipine 5 MG tablet  Commonly known as:  NORVASC  Take 1 tablet (5 mg total) by mouth daily.     cetirizine 10 MG tablet  Commonly known as:  ZYRTEC  Take 10 mg by mouth daily.     fluticasone 50 MCG/ACT nasal spray  Commonly known as:  FLONASE  Place 2 sprays into both nostrils daily.     losartan 100 MG tablet  Commonly known as:  COZAAR  Take 1 tablet (100 mg total) by mouth daily.     multivitamin capsule  Take 1 capsule by mouth daily.     Vitamin D3 3000 units Tabs  Take 1 tablet by mouth daily.           Objective:   Physical Exam BP 122/80 mmHg  Pulse 74  Temp(Src) 98 F (36.7 C) (Oral)  Ht 6\' 3"  (1.905 m)  Wt 201 lb 8 oz (91.4 kg)  BMI 25.19 kg/m2  SpO2 98% General:   Well developed, well nourished . NAD.  Neck:  Full range of motion. Supple. No  Thyromegaly  HEENT:  Normocephalic . Face symmetric, atraumatic Lungs:  CTA B Normal respiratory effort, no intercostal retractions, no accessory muscle use. Heart: RRR,  no murmur.  No pretibial edema bilaterally  Abdomen:  Not distended, soft, non-tender. No rebound or rigidity.  Rectal:  External abnormalities: none. Normal sphincter tone. No rectal masses or tenderness.  No  stools  Prostate: Prostate gland firm and smooth, no enlargement, nodularity, tenderness, mass, asymmetry or induration.  Skin: Exposed areas without rash. Not pale. Not jaundice Neurologic:  alert & oriented X3.  Speech normal, gait appropriate for age and unassisted Strength symmetric and appropriate for age.  Psych: Cognition and judgment appear intact.  Cooperative with normal attention span and concentration.  Behavior appropriate. No anxious or depressed appearing.    Assessment & Plan:   Assessment HTN Hyperlipidemia BCC, dermatology Dr. Martinique H/o hypogonadism used to see Dr. Chalmers Cater, nl free T 2015 H/o elevated LFTs: Hepatitis B and C negative  PLAN: HTN: Amlodipine was added 09/2014, ambulatory BPs 120/80 since. History of hypogonadism, check a free T RTC one year for a CPX  as long as BP is well-controlled.

## 2015-06-23 NOTE — Assessment & Plan Note (Signed)
HTN: Amlodipine was added 09/2014, ambulatory BPs 120/80 since. History of hypogonadism, check a free T RTC one year for a CPX  as long as BP is well-controlled.

## 2015-06-23 NOTE — Assessment & Plan Note (Addendum)
Td 2016 zostavax-- discussed before  had a  flu shot     Cscope 06-2012 --- 2 polyps, next in 5 years Die-exercise discussed  Labs  : CMP, FLP, CBC, PSA, HIV, testosterone

## 2015-06-23 NOTE — Progress Notes (Signed)
Pre visit review using our clinic review tool, if applicable. No additional management support is needed unless otherwise documented below in the visit note. 

## 2015-06-23 NOTE — Patient Instructions (Signed)
BEFORE YOU LEAVE THE OFFICE:  GO TO THE LAB  Get the blood work    GO TO THE FRONT DESK   Schedule a complete physical exam to be done in 1 year Please be fasting    AFTER YOU LEAVE THE OFFICE:  Check the  blood pressure 2 or 3 times a month  Be sure your blood pressure is between 110/65 and  145/85. If it is consistently higher or lower, let me know

## 2015-06-24 LAB — HIV ANTIBODY (ROUTINE TESTING W REFLEX): HIV 1&2 Ab, 4th Generation: NONREACTIVE

## 2015-06-24 LAB — TESTOSTERONE, FREE, TOTAL, SHBG
Sex Hormone Binding: 21 nmol/L (ref 10–50)
TESTOSTERONE: 207 ng/dL — AB (ref 300–890)
Testosterone, Free: 50 pg/mL (ref 47.0–244.0)
Testosterone-% Free: 2.4 % (ref 1.6–2.9)

## 2015-07-04 ENCOUNTER — Other Ambulatory Visit: Payer: Self-pay | Admitting: Internal Medicine

## 2015-08-04 ENCOUNTER — Other Ambulatory Visit: Payer: Self-pay | Admitting: Internal Medicine

## 2015-09-12 ENCOUNTER — Other Ambulatory Visit: Payer: Self-pay

## 2015-09-12 MED ORDER — AMLODIPINE BESYLATE 5 MG PO TABS: 5.0000 mg | ORAL_TABLET | Freq: Every day | ORAL | Status: DC

## 2015-09-12 MED ORDER — LOSARTAN POTASSIUM 100 MG PO TABS: 100.0000 mg | ORAL_TABLET | Freq: Every day | ORAL | Status: DC

## 2015-09-16 ENCOUNTER — Telehealth: Payer: Self-pay | Admitting: Internal Medicine

## 2015-09-16 MED ORDER — LOSARTAN POTASSIUM 100 MG PO TABS: 100.0000 mg | ORAL_TABLET | Freq: Every day | ORAL | Status: DC

## 2015-09-16 MED ORDER — AMLODIPINE BESYLATE 5 MG PO TABS: 5.0000 mg | ORAL_TABLET | Freq: Every day | ORAL | Status: DC

## 2015-09-16 NOTE — Telephone Encounter (Signed)
Caller name: Self  Can be reached:  [phone]   Pharmacy:  [facility], [place] [phone] (Phone) [phone] (Fax)         Reason for call: Patient request that rx for losartan (COZAAR) 100 MG tablet [id number] and amLODipine (NORVASC) 5 MG tablet [id number]  Be sent to [facility].

## 2015-09-16 NOTE — Telephone Encounter (Signed)
Rx's resent to [facility] 

## 2015-09-16 NOTE — Telephone Encounter (Signed)
Caller name: Katharine Look @ [facility] Relationship to patient: Can be reached: [phone]   Reason for call: Pt has not filled amlodipine 5mg  or losartan 100mg  at [facility]. Pt requested it from [facility] in 90 day supply on 09/10/15 per Katharine Look and they faxed request to [place] same day. Please resend orders to [facility] and cancel at local pharmacy.

## 2016-04-10 ENCOUNTER — Ambulatory Visit (INDEPENDENT_AMBULATORY_CARE_PROVIDER_SITE_OTHER): Payer: Managed Care, Other (non HMO)

## 2016-04-10 DIAGNOSIS — Z23 Encounter for immunization: Secondary | ICD-10-CM

## 2016-06-22 ENCOUNTER — Encounter: Payer: BLUE CROSS/BLUE SHIELD | Admitting: Internal Medicine

## 2016-07-30 ENCOUNTER — Encounter: Payer: Managed Care, Other (non HMO) | Admitting: Internal Medicine

## 2016-09-17 ENCOUNTER — Telehealth: Payer: Self-pay | Admitting: Internal Medicine

## 2016-09-17 MED ORDER — FLUTICASONE PROPIONATE 50 MCG/ACT NA SUSP: 2.0000 | Freq: Every day | NASAL | 0 refills | Status: DC

## 2016-09-17 NOTE — Telephone Encounter (Signed)
Last OV 05/2015, cpe scheduled for 11/2016. Rx sent.

## 2016-09-17 NOTE — Telephone Encounter (Signed)
Caller name: Marrell Dicaprio Relationship to patient: self Can be reached: [phone] Pharmacy: [facility]  Reason for call: Pt would like to see if Dr. Larose Kells can send in [place] in 90 day supply to mail order [facility] for him. Pt had to reschedule cpe once to provider schedule then once due to his schedule. Pt is out. Please let him know if not able to send in.

## 2016-09-18 ENCOUNTER — Encounter: Payer: Managed Care, Other (non HMO) | Admitting: Internal Medicine

## 2016-10-24 ENCOUNTER — Other Ambulatory Visit: Payer: Self-pay | Admitting: Internal Medicine

## 2016-10-31 ENCOUNTER — Ambulatory Visit (INDEPENDENT_AMBULATORY_CARE_PROVIDER_SITE_OTHER): Payer: Managed Care, Other (non HMO) | Admitting: Internal Medicine

## 2016-10-31 ENCOUNTER — Encounter: Payer: Self-pay | Admitting: Internal Medicine

## 2016-10-31 VITALS — BP 118/78 | HR 82 | Temp 97.8°F | Resp 14 | Ht 74.5 in | Wt 193.0 lb

## 2016-10-31 DIAGNOSIS — Z0000 Encounter for general adult medical examination without abnormal findings: Secondary | ICD-10-CM | POA: Diagnosis not present

## 2016-10-31 LAB — COMPREHENSIVE METABOLIC PANEL
ALK PHOS: 77 U/L (ref 39–117)
ALT: 27 U/L (ref 0–53)
AST: 19 U/L (ref 0–37)
Albumin: 4.6 g/dL (ref 3.5–5.2)
BILIRUBIN TOTAL: 1 mg/dL (ref 0.2–1.2)
BUN: 16 mg/dL (ref 6–23)
CO2: 28 meq/L (ref 19–32)
CREATININE: 0.98 mg/dL (ref 0.40–1.50)
Calcium: 9.8 mg/dL (ref 8.4–10.5)
Chloride: 104 mEq/L (ref 96–112)
GFR: 84.57 mL/min (ref >60.00)
Glucose, Bld: 86 mg/dL (ref 70–99)
Potassium: 4 mEq/L (ref 3.5–5.1)
Sodium: 138 mEq/L (ref 135–145)
TOTAL PROTEIN: 7.3 g/dL (ref 6.0–8.3)

## 2016-10-31 LAB — CBC WITH DIFFERENTIAL/PLATELET
BASOS ABS: 0.1 10*3/uL (ref 0.0–0.1)
Basophils Relative: 0.8 % (ref 0.0–3.0)
EOS ABS: 0.1 10*3/uL (ref 0.0–0.7)
Eosinophils Relative: 1.6 % (ref 0.0–5.0)
HEMATOCRIT: 44.8 % (ref 39.0–52.0)
HEMOGLOBIN: 15.4 g/dL (ref 13.0–17.0)
LYMPHS PCT: 24.6 % (ref 12.0–46.0)
Lymphs Abs: 2.2 10*3/uL (ref 0.7–4.0)
MCHC: 34.4 g/dL (ref 30.0–36.0)
MCV: 87.5 fl (ref 78.0–100.0)
Monocytes Absolute: 0.6 10*3/uL (ref 0.1–1.0)
Monocytes Relative: 6.4 % (ref 3.0–12.0)
Neutro Abs: 5.9 10*3/uL (ref 1.4–7.7)
Neutrophils Relative %: 66.6 % (ref 43.0–77.0)
Platelets: 244 10*3/uL (ref 150.0–400.0)
RBC: 5.12 Mil/uL (ref 4.22–5.81)
RDW: 13 % (ref 11.5–15.5)
WBC: 8.8 10*3/uL (ref 4.0–10.5)

## 2016-10-31 LAB — LIPID PANEL
CHOL/HDL RATIO: 5
Cholesterol: 196 mg/dL (ref 0–200)
HDL: 36.7 mg/dL — AB (ref >39.00)
LDL CALC: 128 mg/dL — AB (ref 0–99)
NONHDL: 159.3
Triglycerides: 157 mg/dL — ABNORMAL HIGH (ref 0.0–149.0)
VLDL: 31.4 mg/dL (ref 0.0–40.0)

## 2016-10-31 LAB — TSH: TSH: 0.96 u[IU]/mL (ref 0.35–4.50)

## 2016-10-31 MED ORDER — LOSARTAN POTASSIUM 100 MG PO TABS: 100.0000 mg | ORAL_TABLET | Freq: Every day | ORAL | 3 refills | Status: DC

## 2016-10-31 MED ORDER — CELECOXIB 200 MG PO CAPS: 200.0000 mg | ORAL_CAPSULE | Freq: Every day | ORAL | 0 refills | Status: DC

## 2016-10-31 MED ORDER — AMLODIPINE BESYLATE 5 MG PO TABS: 5.0000 mg | ORAL_TABLET | Freq: Every day | ORAL | 3 refills | Status: DC

## 2016-10-31 NOTE — Progress Notes (Signed)
Subjective:    Patient ID: Gary Curry, male    DOB: 09-Jan-1961, 56 y.o.   MRN: [id number]  DOS:  10/31/2016 Type of visit - description : cpx Interval history: In general feeling well. Good compliance of medications. Ambulatory BPs always normal Doing better with diet, has lost some weight  Wt Readings from Last 3 Encounters:  10/31/16 193 lb (87.5 kg)  06/23/15 201 lb 8 oz (91.4 kg)  10/11/14 200 lb 8 oz (90.9 kg)    Review of Systems 8 week history of right shoulder pain, particularly when he reach back. No injury, denies neck pain per se. Aleve helped the pain to some extent  Other than above, a 14 point review of systems is negative    Past Medical History:  Diagnosis Date  . Basal cell carcinoma 12 years ago   sees derm Dr Martinique  . H/O hypogonadism    used to see Dr Chalmers Cater  . Hyperlipidemia   . Hypertension   . MVP (mitral valve prolapse)    question of.., dx in the 90s    Past Surgical History:  Procedure Laterality Date  . BACK SURGERY  2000/2002  . VARICOCELE EXCISION  2011   Family History  Problem Relation Age of Onset  . Hyperlipidemia Mother   . Breast cancer Mother   . Heart disease Other        GF  . Colon cancer Paternal Grandfather   . Prostate cancer Neg Hx   . Esophageal cancer Neg Hx   . Rectal cancer Neg Hx   . Stomach cancer Neg Hx      Social History   Social History  . Marital status: Married    Spouse name: [name]  . Number of children: 0  . Years of education: N/A   Occupational History  . business analyst [employer]   Social History Main Topics  . Smoking status: Never Smoker  . Smokeless tobacco: Never Used  . Alcohol use Yes     Comment: rarely   . Drug use: No  . Sexual activity: Not on file   Other Topics Concern  . Not on file   Social History Narrative   Household-- pt and wife      Allergies as of 10/31/2016   No Known Allergies     Medication List       Accurate as of 10/31/16 11:59  PM. Always use your most recent med list.          amLODipine 5 MG tablet Commonly known as:  NORVASC Take 1 tablet (5 mg total) by mouth daily.   celecoxib 200 MG capsule Commonly known as:  CELEBREX Take 1 capsule (200 mg total) by mouth daily.   cetirizine 10 MG tablet Commonly known as:  ZYRTEC Take 10 mg by mouth daily.   fluticasone 50 MCG/ACT nasal spray Commonly known as:  FLONASE Place 2 sprays into both nostrils daily.   losartan 100 MG tablet Commonly known as:  COZAAR Take 1 tablet (100 mg total) by mouth daily.   multivitamin capsule Take 1 capsule by mouth daily.   Vitamin D3 3000 units Tabs Take 1 tablet by mouth daily.          Objective:   Physical Exam BP 118/78 (BP Location: Left Arm, Patient Position: Sitting, Cuff Size: Normal)   Pulse 82   Temp 97.8 F (36.6 C) (Oral)   Resp 14   Ht 6' 2.5" (1.892 m)  Wt 193 lb (87.5 kg)   SpO2 98%   BMI 24.45 kg/m   General:   Well developed, well nourished . NAD.  Neck: No  thyromegaly  HEENT:  Normocephalic . Face symmetric, atraumatic Lungs:  CTA B Normal respiratory effort, no intercostal retractions, no accessory muscle use. Heart: RRR,  no murmur.  No pretibial edema bilaterally  Abdomen:  Not distended, soft, non-tender. No rebound or rigidity.   MSK: Left shoulder normal Right shoulder: No deformity, no TTP. Passive and active range of motion limited by pain with arm elevation. Skin: Exposed areas without rash. Not pale. Not jaundice Neurologic:  alert & oriented X3.  Speech normal, gait appropriate for age and unassisted Strength symmetric and appropriate for age.  Psych: Cognition and judgment appear intact.  Cooperative with normal attention span and concentration.  Behavior appropriate. No anxious or depressed appearing.    Assessment & Plan:   Assessment HTN Hyperlipidemia BCC, dermatology Dr. Martinique H/o hypogonadism used to see Dr. Chalmers Cater, nl free T 2015 H/o elevated  LFTs: Hepatitis B and C negative  PLAN: HTN: Well controlled with amlodipine and losartan. Checking labs Hyperlipidemia: Diet control, check labs H/o hypogonadism, free testost.consistently normal. No symptoms. BCC: Sees dermatology regularly Shoulder pain: Celebrex as needed for few days, if not better will call for sports medicine referral RTC one year.

## 2016-10-31 NOTE — Progress Notes (Signed)
Pre visit review using our clinic review tool, if applicable. No additional management support is needed unless otherwise documented below in the visit note. 

## 2016-10-31 NOTE — Patient Instructions (Signed)
GO TO THE LAB : Get the blood work     GO TO THE FRONT DESK Schedule your next appointment for a  complete physical exam in one year    Check the  blood pressure monthly Be sure your blood pressure is between 110/65 and  145/85. If it is consistently higher or lower, let me know  Shoulder pain: Celebrex 1 tablet daily for one week, then as needed Always take it with food because may cause gastritis and ulcers.  If you notice nausea, stomach pain, change in the color of stools --->  Stop the medicine and let us know

## 2016-10-31 NOTE — Assessment & Plan Note (Signed)
--  Td 2016; shingles shots discussed -- CCS: Cscope 06-2012 --- 2 polyps, next in 5 years -- prostate ca screening: DRE-PSA wnl 05-2015 --Die-exercise discussed , he is actually doing well, has lost some weight. --Labs  : CMP, FLP, CBC, TSH

## 2016-11-01 NOTE — Assessment & Plan Note (Signed)
HTN: Well controlled with amlodipine and losartan. Checking labs Hyperlipidemia: Diet control, check labs H/o hypogonadism, free testost.consistently normal. No symptoms. BCC: Sees dermatology regularly Shoulder pain: Celebrex as needed for few days, if not better will call for sports medicine referral RTC one year.

## 2016-11-05 ENCOUNTER — Telehealth: Payer: Self-pay

## 2016-11-05 NOTE — Telephone Encounter (Signed)
PA initiated via Covermymeds; KEY: [id number]. PA approved.   CaseId:[id number];Status:Approved;Review Type:Prior Auth;Coverage Start Date:10/06/2016;Coverage End Date:11/05/2017;

## 2016-12-05 ENCOUNTER — Encounter: Payer: Managed Care, Other (non HMO) | Admitting: Internal Medicine

## 2017-01-03 ENCOUNTER — Other Ambulatory Visit: Payer: Self-pay | Admitting: Internal Medicine

## 2017-03-28 ENCOUNTER — Ambulatory Visit (INDEPENDENT_AMBULATORY_CARE_PROVIDER_SITE_OTHER): Payer: Managed Care, Other (non HMO) | Admitting: Behavioral Health

## 2017-03-28 DIAGNOSIS — Z23 Encounter for immunization: Secondary | ICD-10-CM | POA: Diagnosis not present

## 2017-03-28 NOTE — Progress Notes (Signed)
Pre visit review using our clinic review tool, if applicable. No additional management support is needed unless otherwise documented below in the visit note.  Patient came in clinic today for influenza vaccination. IM injection was given in the left deltoid. Patient tolerated the injection well.

## 2017-08-31 ENCOUNTER — Encounter: Payer: Self-pay | Admitting: Internal Medicine

## 2017-09-25 ENCOUNTER — Encounter: Payer: Self-pay | Admitting: Internal Medicine

## 2017-10-04 ENCOUNTER — Other Ambulatory Visit: Payer: Self-pay | Admitting: Internal Medicine

## 2017-12-04 ENCOUNTER — Encounter: Payer: Self-pay | Admitting: Internal Medicine

## 2017-12-04 ENCOUNTER — Ambulatory Visit (INDEPENDENT_AMBULATORY_CARE_PROVIDER_SITE_OTHER): Payer: Managed Care, Other (non HMO) | Admitting: Internal Medicine

## 2017-12-04 VITALS — BP 130/80 | Temp 98.2°F | Resp 14 | Ht 74.0 in | Wt 191.0 lb

## 2017-12-04 DIAGNOSIS — Z0000 Encounter for general adult medical examination without abnormal findings: Secondary | ICD-10-CM

## 2017-12-04 LAB — CBC WITH DIFFERENTIAL/PLATELET
Basophils Absolute: 0.1 10*3/uL (ref 0.0–0.1)
Basophils Relative: 0.8 % (ref 0.0–3.0)
EOS ABS: 0.1 10*3/uL (ref 0.0–0.7)
Eosinophils Relative: 2.1 % (ref 0.0–5.0)
HEMATOCRIT: 45.1 % (ref 39.0–52.0)
Hemoglobin: 15.5 g/dL (ref 13.0–17.0)
LYMPHS PCT: 23.4 % (ref 12.0–46.0)
Lymphs Abs: 1.6 10*3/uL (ref 0.7–4.0)
MCHC: 34.4 g/dL (ref 30.0–36.0)
MCV: 88.5 fl (ref 78.0–100.0)
Monocytes Absolute: 0.5 10*3/uL (ref 0.1–1.0)
Monocytes Relative: 7.4 % (ref 3.0–12.0)
Neutro Abs: 4.6 10*3/uL (ref 1.4–7.7)
Neutrophils Relative %: 66.3 % (ref 43.0–77.0)
PLATELETS: 225 10*3/uL (ref 150.0–400.0)
RBC: 5.09 Mil/uL (ref 4.22–5.81)
RDW: 13.1 % (ref 11.5–15.5)
WBC: 7 10*3/uL (ref 4.0–10.5)

## 2017-12-04 LAB — LIPID PANEL
Cholesterol: 205 mg/dL — ABNORMAL HIGH (ref 0–200)
HDL: 33.4 mg/dL — ABNORMAL LOW (ref >39.00)
LDL CALC: 138 mg/dL — AB (ref 0–99)
NONHDL: 171.21
Total CHOL/HDL Ratio: 6
Triglycerides: 166 mg/dL — ABNORMAL HIGH (ref 0.0–149.0)
VLDL: 33.2 mg/dL (ref 0.0–40.0)

## 2017-12-04 LAB — COMPREHENSIVE METABOLIC PANEL
ALT: 31 U/L (ref 0–53)
AST: 21 U/L (ref 0–37)
Albumin: 4.4 g/dL (ref 3.5–5.2)
Alkaline Phosphatase: 68 U/L (ref 39–117)
BILIRUBIN TOTAL: 1 mg/dL (ref 0.2–1.2)
BUN: 17 mg/dL (ref 6–23)
CALCIUM: 9.6 mg/dL (ref 8.4–10.5)
CHLORIDE: 103 meq/L (ref 96–112)
CO2: 28 meq/L (ref 19–32)
Creatinine, Ser: 0.98 mg/dL (ref 0.40–1.50)
GFR: 84.23 mL/min (ref >60.00)
Glucose, Bld: 93 mg/dL (ref 70–99)
Potassium: 4.5 mEq/L (ref 3.5–5.1)
Sodium: 138 mEq/L (ref 135–145)
Total Protein: 6.8 g/dL (ref 6.0–8.3)

## 2017-12-04 LAB — PSA: PSA: 2.65 ng/mL (ref 0.10–4.00)

## 2017-12-04 NOTE — Progress Notes (Signed)
Pre visit review using our clinic review tool, if applicable. No additional management support is needed unless otherwise documented below in the visit note. 

## 2017-12-04 NOTE — Assessment & Plan Note (Addendum)
--  Td 2016; shingles not available  -- CCS: Cscope 06-2012 - 2 polyps; due for cscope, already contacted GI, plans to proceed -- prostate ca screening: DRE normal, check a PSA --Die-exercise discussed : life style is very good  --Labs: CMP, CBC, FLP, PSA

## 2017-12-04 NOTE — Patient Instructions (Signed)
GO TO THE LAB : Get the blood work     GO TO THE FRONT DESK Schedule your next appointment for a physical exam in 1 year   Check the  blood pressure  monthly   Be sure your blood pressure is between 110/65 and  135/85. If it is consistently higher or lower, let me know    

## 2017-12-04 NOTE — Progress Notes (Signed)
Subjective:    Patient ID: Gary Curry, male    DOB: Nov 18, 1960, 57 y.o.   MRN: [id number]  DOS:  12/04/2017 Type of visit - description : cpx Interval history: Doing well, no major concerns.  Review of Systems  A 14 point review of systems is negative    Past Medical History:  Diagnosis Date  . Basal cell carcinoma 12 years ago   sees derm Dr Martinique  . H/O hypogonadism    used to see Dr Chalmers Cater  . Hyperlipidemia   . Hypertension   . MVP (mitral valve prolapse)    question of.., dx in the 90s    Past Surgical History:  Procedure Laterality Date  . BACK SURGERY  2000/2002  . VARICOCELE EXCISION  2011    Social History   Socioeconomic History  . Marital status: Married    Spouse name: [name]  . Number of children: 0  . Years of education: Not on file  . Highest education level: Not on file  Occupational History  . Occupation: business Printmaker: [employer]  Social Needs  . Financial resource strain: Not on file  . Food insecurity:    Worry: Not on file    Inability: Not on file  . Transportation needs:    Medical: Not on file    Non-medical: Not on file  Tobacco Use  . Smoking status: Never Smoker  . Smokeless tobacco: Never Used  Substance and Sexual Activity  . Alcohol use: Yes    Comment: rarely   . Drug use: No  . Sexual activity: Not on file  Lifestyle  . Physical activity:    Days per week: Not on file    Minutes per session: Not on file  . Stress: Not on file  Relationships  . Social connections:    Talks on phone: Not on file    Gets together: Not on file    Attends religious service: Not on file    Active member of club or organization: Not on file    Attends meetings of clubs or organizations: Not on file    Relationship status: Not on file  . Intimate partner violence:    Fear of current or ex partner: Not on file    Emotionally abused: Not on file    Physically abused: Not on file    Forced sexual  activity: Not on file  Other Topics Concern  . Not on file  Social History Narrative   Household-- pt and wife     Family History  Problem Relation Age of Onset  . Hyperlipidemia Mother   . Breast cancer Mother   . Heart disease Other        GF  . Colon cancer Paternal Grandfather   . Prostate cancer Neg Hx   . Esophageal cancer Neg Hx   . Rectal cancer Neg Hx   . Stomach cancer Neg Hx      Allergies as of 12/04/2017   No Known Allergies     Medication List        Accurate as of 12/04/17 10:54 AM. Always use your most recent med list.          amLODipine 5 MG tablet Commonly known as:  NORVASC Take 1 tablet (5 mg total) by mouth daily.   cetirizine 10 MG tablet Commonly known as:  ZYRTEC Take 10 mg by mouth daily.   fluticasone 50 MCG/ACT nasal spray Commonly known as:  FLONASE Place 2 sprays into both nostrils daily as needed for allergies or rhinitis.   losartan 100 MG tablet Commonly known as:  COZAAR Take 1 tablet (100 mg total) by mouth daily.   multivitamin capsule Take 1 capsule by mouth daily.          Objective:   Physical Exam BP 130/80 (BP Location: Right Arm, Patient Position: Sitting, Cuff Size: Normal)   Temp 98.2 F (36.8 C)   Resp 14   Ht 6\' 2"  (1.88 m)   Wt 191 lb (86.6 kg)   SpO2 98%   BMI 24.52 kg/m   General: Well developed, NAD, see BMI.  Neck: No  thyromegaly  HEENT:  Normocephalic . Face symmetric, atraumatic Lungs:  CTA B Normal respiratory effort, no intercostal retractions, no accessory muscle use. Heart: RRR,  no murmur.  No pretibial edema bilaterally  Abdomen:  Not distended, soft, non-tender. No rebound or rigidity.   Rectal: External abnormalities: none. Normal sphincter tone. No rectal masses or tenderness.  Normal stools found Prostate: Prostate gland firm and smooth, no enlargement, nodularity, tenderness, mass, asymmetry or induration Skin: Exposed areas without rash. Not pale. Not  jaundice Neurologic:  alert & oriented X3.  Speech normal, gait appropriate for age and unassisted Strength symmetric and appropriate for age.  Psych: Cognition and judgment appear intact.  Cooperative with normal attention span and concentration.  Behavior appropriate. No anxious or depressed appearing.     Assessment & Plan:   Assessment HTN Hyperlipidemia BCC, dermatology Dr. Martinique H/o hypogonadism used to see Dr. Chalmers Cater, nl free T 2015 H/o elevated LFTs: Hepatitis B and C negative  PLAN: HTN: Continue amlodipine, losartan.  Recommend ambulatory BPs.  Will call for 90-day refills as needed Hyperlipidemia: Diet controlled.  Checking labs BCC: Sees dermatology regularly. RTC 1 year

## 2017-12-04 NOTE — Assessment & Plan Note (Signed)
HTN: Continue amlodipine, losartan.  Recommend ambulatory BPs.  Will call for 90-day refills as needed Hyperlipidemia: Diet controlled.  Checking labs BCC: Sees dermatology regularly. RTC 1 year

## 2017-12-06 ENCOUNTER — Encounter: Payer: Self-pay | Admitting: Internal Medicine

## 2017-12-06 ENCOUNTER — Ambulatory Visit (AMBULATORY_SURGERY_CENTER): Payer: Self-pay

## 2017-12-06 VITALS — Ht 74.0 in | Wt 193.8 lb

## 2017-12-06 DIAGNOSIS — Z86010 Personal history of colonic polyps: Secondary | ICD-10-CM

## 2017-12-06 DIAGNOSIS — Z800 Family history of malignant neoplasm of digestive organs: Secondary | ICD-10-CM

## 2017-12-06 MED ORDER — NA SULFATE-K SULFATE-MG SULF 17.5-3.13-1.6 GM/177ML PO SOLN: 1.0000 | Freq: Once | ORAL | 0 refills | Status: AC

## 2017-12-06 NOTE — Progress Notes (Signed)
Per pt, no allergies to soy or egg products.Pt not taking any weight loss meds or using  O2 at home.  Pt refused emmi video. 

## 2017-12-19 ENCOUNTER — Encounter: Payer: Self-pay | Admitting: Internal Medicine

## 2017-12-19 ENCOUNTER — Ambulatory Visit (AMBULATORY_SURGERY_CENTER): Payer: Managed Care, Other (non HMO) | Admitting: Internal Medicine

## 2017-12-19 VITALS — BP 120/66 | HR 73 | Temp 98.6°F | Resp 12 | Ht 74.0 in | Wt 191.0 lb

## 2017-12-19 DIAGNOSIS — D122 Benign neoplasm of ascending colon: Secondary | ICD-10-CM | POA: Diagnosis not present

## 2017-12-19 DIAGNOSIS — Z86010 Personal history of colonic polyps: Secondary | ICD-10-CM | POA: Diagnosis present

## 2017-12-19 MED ORDER — SODIUM CHLORIDE 0.9 % IV SOLN: 500.0000 mL | Freq: Once | INTRAVENOUS | Status: DC

## 2017-12-19 NOTE — Op Note (Signed)
[facility] Patient Name: Gary Curry Procedure Date: 12/19/2017 10:11 AM MRN: [id number] Endoscopist: Docia Chuck. Henrene Pastor , MD Age: 57 Referring MD:  Date of Birth: 1960-10-24 Gender: Male Account #: [id number] Procedure:                Colonoscopy, with cold snare polypectomy x 1 Indications:              High risk colon cancer surveillance: Personal                            history of non-advanced adenoma. Previous                            examination January 2014 Medicines:                Monitored Anesthesia Care Procedure:                Pre-Anesthesia Assessment:                           - Prior to the procedure, a History and Physical                            was performed, and patient medications and                            allergies were reviewed. The patient's tolerance of                            previous anesthesia was also reviewed. The risks                            and benefits of the procedure and the sedation                            options and risks were discussed with the patient.                            All questions were answered, and informed consent                            was obtained. Prior Anticoagulants: The patient has                            taken no previous anticoagulant or antiplatelet                            agents. ASA Grade Assessment: II - A patient with                            mild systemic disease. After reviewing the risks                            and benefits, the patient was deemed in  satisfactory condition to undergo the procedure.                           After obtaining informed consent, the colonoscope                            was passed under direct vision. Throughout the                            procedure, the patient's blood pressure, pulse, and                            oxygen saturations were monitored continuously. The                            Colonoscope was  introduced through the anus and                            advanced to the the cecum, identified by                            appendiceal orifice and ileocecal valve. The                            ileocecal valve, appendiceal orifice, and rectum                            were photographed. The quality of the bowel                            preparation was excellent. The colonoscopy was                            performed without difficulty. The patient tolerated                            the procedure well. The bowel preparation used was                            SUPREP. Scope In: 10:21:58 AM Scope Out: 10:34:44 AM Scope Withdrawal Time: 0 hours 10 minutes 40 seconds  Total Procedure Duration: 0 hours 12 minutes 46 seconds  Findings:                 A 3 mm polyp was found in the ascending colon. The                            polyp was removed with a cold snare. Resection and                            retrieval were complete.                           Multiple small and large-mouthed diverticula were  found in the transverse colon and left colon.                           Internal hemorrhoids were found during retroflexion.                           The exam was otherwise without abnormality on                            direct and retroflexion views. Complications:            No immediate complications. Estimated blood loss:                            None. Estimated Blood Loss:     Estimated blood loss: none. Impression:               - One 3 mm polyp in the ascending colon, removed                            with a cold snare. Resected and retrieved.                           - Diverticulosis in the transverse colon and in the                            left colon.                           - Internal hemorrhoids.                           - The examination was otherwise normal on direct                            and retroflexion  views. Recommendation:           - Repeat colonoscopy in 5 years for surveillance.                           - Patient has a contact number available for                            emergencies. The signs and symptoms of potential                            delayed complications were discussed with the                            patient. Return to normal activities tomorrow.                            Written discharge instructions were provided to the                            patient.                           -  Resume previous diet.                           - Continue present medications.                           - Await pathology results. Docia Chuck. Henrene Pastor, MD 12/19/2017 10:39:02 AM This report has been signed electronically.

## 2017-12-19 NOTE — Progress Notes (Signed)
Called to room to assist during endoscopic procedure.  Patient ID and intended procedure confirmed with present staff. Received instructions for my participation in the procedure from the performing physician.  

## 2017-12-19 NOTE — Progress Notes (Signed)
Report to PACU, RN, vss, BBS= Clear.  

## 2017-12-19 NOTE — Progress Notes (Signed)
Pt's states no medical or surgical changes since previsit or office visit. 

## 2017-12-19 NOTE — Patient Instructions (Signed)
   INFORMATION ON POLYPS,DIVERTCULOSIS ,AND HEMORRHOIDS GIVEN TO YOU TODAY  AWAIT PATHOLOGY RESULTS ON POLYP REMOVED     YOU HAD AN ENDOSCOPIC PROCEDURE TODAY AT [facility]:   Refer to the procedure report that was given to you for any specific questions about what was found during the examination.  If the procedure report does not answer your questions, please call your gastroenterologist to clarify.  If you requested that your care partner not be given the details of your procedure findings, then the procedure report has been included in a sealed envelope for you to review at your convenience later.  YOU SHOULD EXPECT: Some feelings of bloating in the abdomen. Passage of more gas than usual.  Walking can help get rid of the air that was put into your GI tract during the procedure and reduce the bloating. If you had a lower endoscopy (such as a colonoscopy or flexible sigmoidoscopy) you may notice spotting of blood in your stool or on the toilet paper. If you underwent a bowel prep for your procedure, you may not have a normal bowel movement for a few days.  Please Note:  You might notice some irritation and congestion in your nose or some drainage.  This is from the oxygen used during your procedure.  There is no need for concern and it should clear up in a day or so.  SYMPTOMS TO REPORT IMMEDIATELY:   Following lower endoscopy (colonoscopy or flexible sigmoidoscopy):  Excessive amounts of blood in the stool  Significant tenderness or worsening of abdominal pains  Swelling of the abdomen that is new, acute  Fever of 100F or higher    For urgent or emergent issues, a gastroenterologist can be reached at any hour by calling [phone].   DIET:  We do recommend a small meal at first, but then you may proceed to your regular diet.  Drink plenty of fluids but you should avoid alcoholic beverages for 24 hours.  ACTIVITY:  You should plan to take it easy for the rest of  today and you should NOT DRIVE or use heavy machinery until tomorrow (because of the sedation medicines used during the test).    FOLLOW UP: Our staff will call the number listed on your records the next business day following your procedure to check on you and address any questions or concerns that you may have regarding the information given to you following your procedure. If we do not reach you, we will leave a message.  However, if you are feeling well and you are not experiencing any problems, there is no need to return our call.  We will assume that you have returned to your regular daily activities without incident.  If any biopsies were taken you will be contacted by phone or by letter within the next 1-3 weeks.  Please call us at [phone] if you have not heard about the biopsies in 3 weeks.    SIGNATURES/CONFIDENTIALITY: You and/or your care partner have signed paperwork which will be entered into your electronic medical record.  These signatures attest to the fact that that the information above on your After Visit Summary has been reviewed and is understood.  Full responsibility of the confidentiality of this discharge information lies with you and/or your care-partner.

## 2017-12-20 ENCOUNTER — Telehealth: Payer: Self-pay | Admitting: *Deleted

## 2017-12-20 NOTE — Telephone Encounter (Signed)
  Follow up Call-  Call back number 12/19/2017  Post procedure Call Back phone  # [phone]  Permission to leave phone message Yes  Some recent data might be hidden     Patient questions:  Do you have a fever, pain , or abdominal swelling? No. Pain Score  0 *  Have you tolerated food without any problems? Yes.    Have you been able to return to your normal activities? Yes.    Do you have any questions about your discharge instructions: Diet   No. Medications  No. Follow up visit  No.  Do you have questions or concerns about your Care? No.  Actions: * If pain score is 4 or above: No action needed, pain <4.

## 2017-12-23 ENCOUNTER — Encounter: Payer: Self-pay | Admitting: Internal Medicine

## 2018-01-03 ENCOUNTER — Other Ambulatory Visit: Payer: Self-pay | Admitting: Internal Medicine

## 2018-02-18 ENCOUNTER — Encounter: Payer: Self-pay | Admitting: Internal Medicine

## 2018-02-19 ENCOUNTER — Telehealth: Payer: Self-pay | Admitting: Internal Medicine

## 2018-02-19 ENCOUNTER — Other Ambulatory Visit: Payer: Self-pay | Admitting: Internal Medicine

## 2018-02-19 MED ORDER — SILDENAFIL CITRATE 20 MG PO TABS: 60.0000 mg | ORAL_TABLET | Freq: Every evening | ORAL | 3 refills | Status: DC | PRN

## 2018-02-19 NOTE — Telephone Encounter (Signed)
Routed to [place], [place] covering.

## 2018-02-19 NOTE — Telephone Encounter (Signed)
Copied from [place] [id number]. Topic: Quick Communication - Rx Refill/Question >> Feb 19, 2018 12:41 PM Burchel, Abbi R wrote: Medication: sildenafil (REVATIO) 20 MG tablet  Preferred Pharmacy: [id number] - [place], [place] [address] [place] [address] Phone: [phone] Fax: [phone]  Per pt's wife they cannot get this rx filled through [facility] due to insurance restrictions.  Please re-sent to preferred pharmacy listed above.

## 2018-02-26 ENCOUNTER — Other Ambulatory Visit: Payer: Self-pay | Admitting: Internal Medicine

## 2018-02-26 MED ORDER — SILDENAFIL CITRATE 20 MG PO TABS: 60.0000 mg | ORAL_TABLET | Freq: Every evening | ORAL | 3 refills | Status: DC | PRN

## 2018-02-26 NOTE — Telephone Encounter (Signed)
Rx resent to [facility]

## 2018-02-26 NOTE — Telephone Encounter (Signed)
Pt called to f/up on this request.   °

## 2018-04-03 ENCOUNTER — Ambulatory Visit: Payer: Managed Care, Other (non HMO)

## 2018-04-04 ENCOUNTER — Ambulatory Visit (INDEPENDENT_AMBULATORY_CARE_PROVIDER_SITE_OTHER): Payer: Managed Care, Other (non HMO)

## 2018-04-04 DIAGNOSIS — Z23 Encounter for immunization: Secondary | ICD-10-CM

## 2018-04-21 ENCOUNTER — Ambulatory Visit: Payer: Managed Care, Other (non HMO)

## 2018-09-10 ENCOUNTER — Other Ambulatory Visit: Payer: Self-pay | Admitting: Internal Medicine

## 2018-12-09 ENCOUNTER — Encounter: Payer: Managed Care, Other (non HMO) | Admitting: Internal Medicine

## 2018-12-13 ENCOUNTER — Other Ambulatory Visit: Payer: Self-pay | Admitting: Internal Medicine

## 2019-03-18 ENCOUNTER — Other Ambulatory Visit: Payer: Self-pay

## 2019-03-18 ENCOUNTER — Encounter: Payer: Self-pay | Admitting: Internal Medicine

## 2019-03-18 ENCOUNTER — Ambulatory Visit (INDEPENDENT_AMBULATORY_CARE_PROVIDER_SITE_OTHER): Payer: BC Managed Care – PPO | Admitting: Internal Medicine

## 2019-03-18 VITALS — BP 120/78 | HR 90 | Temp 97.1°F | Resp 16 | Ht 74.0 in | Wt 189.1 lb

## 2019-03-18 DIAGNOSIS — Z23 Encounter for immunization: Secondary | ICD-10-CM

## 2019-03-18 DIAGNOSIS — Z0000 Encounter for general adult medical examination without abnormal findings: Secondary | ICD-10-CM

## 2019-03-18 LAB — COMPREHENSIVE METABOLIC PANEL
ALT: 25 U/L (ref 0–53)
AST: 18 U/L (ref 0–37)
Albumin: 4.3 g/dL (ref 3.5–5.2)
Alkaline Phosphatase: 62 U/L (ref 39–117)
BUN: 25 mg/dL — ABNORMAL HIGH (ref 6–23)
CO2: 26 mEq/L (ref 19–32)
Calcium: 9.9 mg/dL (ref 8.4–10.5)
Chloride: 100 mEq/L (ref 96–112)
Creatinine, Ser: 1.14 mg/dL (ref 0.40–1.50)
GFR: 66.25 mL/min (ref >60.00)
Glucose, Bld: 88 mg/dL (ref 70–99)
Potassium: 4.4 mEq/L (ref 3.5–5.1)
Sodium: 136 mEq/L (ref 135–145)
Total Bilirubin: 1 mg/dL (ref 0.2–1.2)
Total Protein: 6.7 g/dL (ref 6.0–8.3)

## 2019-03-18 LAB — CBC WITH DIFFERENTIAL/PLATELET
Basophils Absolute: 0.1 10*3/uL (ref 0.0–0.1)
Basophils Relative: 0.8 % (ref 0.0–3.0)
Eosinophils Absolute: 0.1 10*3/uL (ref 0.0–0.7)
Eosinophils Relative: 1.6 % (ref 0.0–5.0)
HCT: 45.1 % (ref 39.0–52.0)
Hemoglobin: 15.6 g/dL (ref 13.0–17.0)
Lymphocytes Relative: 24.8 % (ref 12.0–46.0)
Lymphs Abs: 2 10*3/uL (ref 0.7–4.0)
MCHC: 34.6 g/dL (ref 30.0–36.0)
MCV: 88 fl (ref 78.0–100.0)
Monocytes Absolute: 0.6 10*3/uL (ref 0.1–1.0)
Monocytes Relative: 7.9 % (ref 3.0–12.0)
Neutro Abs: 5.2 10*3/uL (ref 1.4–7.7)
Neutrophils Relative %: 64.9 % (ref 43.0–77.0)
Platelets: 266 10*3/uL (ref 150.0–400.0)
RBC: 5.13 Mil/uL (ref 4.22–5.81)
RDW: 12.8 % (ref 11.5–15.5)
WBC: 8 10*3/uL (ref 4.0–10.5)

## 2019-03-18 LAB — LIPID PANEL
Cholesterol: 179 mg/dL (ref 0–200)
HDL: 31.8 mg/dL — ABNORMAL LOW (ref >39.00)
LDL Cholesterol: 113 mg/dL — ABNORMAL HIGH (ref 0–99)
NonHDL: 146.79
Total CHOL/HDL Ratio: 6
Triglycerides: 171 mg/dL — ABNORMAL HIGH (ref 0.0–149.0)
VLDL: 34.2 mg/dL (ref 0.0–40.0)

## 2019-03-18 LAB — PSA: PSA: 2.14 ng/mL (ref 0.10–4.00)

## 2019-03-18 NOTE — Progress Notes (Signed)
Pre visit review using our clinic review tool, if applicable. No additional management support is needed unless otherwise documented below in the visit note. 

## 2019-03-18 NOTE — Patient Instructions (Addendum)
GO TO THE LAB : Get the blood work     GO TO THE FRONT DESK Schedule your next appointment   For a physical exam in 1 year   Check the  blood pressure 2  times a month BP GOAL is between 110/65 and  135/85. If it is consistently higher or lower, let me know   [email or web address]

## 2019-03-18 NOTE — Assessment & Plan Note (Addendum)
-  Td 2016 - shingrex discussed, elected to wait - flu shot today -- CCS: Cscope 06-2012 - 2 polyps; cscope 11/2017, next per GI -- prostate ca screening:2019, DRE wnl, PSA normal but higher than before,  recheck PSA today --Die-exercise discussed : life style is very good  --Labs: CMP, FLP, CBC, PSA

## 2019-03-18 NOTE — Progress Notes (Signed)
Subjective:    Patient ID: Gary Curry, male    DOB: Jan 17, 1961, 58 y.o.   MRN: [id number]  DOS:  03/18/2019 Type of visit - description: CPX Since the last year, he is doing well, doing extremely well with quarantine precautions because his wife is immunosuppressed. Fortunately, they have not developed any symptoms  Wt Readings from Last 3 Encounters:  03/18/19 189 lb 2 oz (85.8 kg)  12/19/17 191 lb (86.6 kg)  12/06/17 193 lb 12.8 oz (87.9 kg)     Review of Systems  Other than above, a 14 point review of systems is negative     Past Medical History:  Diagnosis Date  . Allergy    seasonal allergies  . Basal cell carcinoma 12 years ago   sees derm Dr Martinique  . H/O hypogonadism    used to see Dr Chalmers Cater  . Hyperlipidemia   . Hypertension   . MVP (mitral valve prolapse)    question of.., dx in the 90s    Past Surgical History:  Procedure Laterality Date  . BACK SURGERY  2000/2002   microdiscectomy  . VARICOCELE EXCISION  2011    Social History   Socioeconomic History  . Marital status: Married    Spouse name: [name]  . Number of children: 0  . Years of education: Not on file  . Highest education level: Not on file  Occupational History  . Occupation: Scientist, forensic for [employer]  . Financial resource strain: Not on file  . Food insecurity    Worry: Not on file    Inability: Not on file  . Transportation needs    Medical: Not on file    Non-medical: Not on file  Tobacco Use  . Smoking status: Never Smoker  . Smokeless tobacco: Never Used  Substance and Sexual Activity  . Alcohol use: Not Currently  . Drug use: No  . Sexual activity: Not on file  Lifestyle  . Physical activity    Days per week: Not on file    Minutes per session: Not on file  . Stress: Not on file  Relationships  . Social Herbalist on phone: Not on file    Gets together: Not on file    Attends religious service: Not on file    Active member of club or  organization: Not on file    Attends meetings of clubs or organizations: Not on file    Relationship status: Not on file  . Intimate partner violence    Fear of current or ex partner: Not on file    Emotionally abused: Not on file    Physically abused: Not on file    Forced sexual activity: Not on file  Other Topics Concern  . Not on file  Social History Narrative   Household-- pt and wife [name]      Family History  Problem Relation Age of Onset  . Hyperlipidemia Mother   . Breast cancer Mother   . Heart disease Other        GF  . Colon cancer Paternal Grandfather   . Prostate cancer Neg Hx   . Esophageal cancer Neg Hx   . Rectal cancer Neg Hx   . Stomach cancer Neg Hx      Allergies as of 03/18/2019   No Known Allergies     Medication List       Accurate as of March 18, 2019 11:59 PM. If you have  any questions, ask your nurse or doctor.        amLODipine 5 MG tablet Commonly known as: NORVASC Take 1 tablet (5 mg total) by mouth daily.   cetirizine 10 MG tablet Commonly known as: ZYRTEC Take 10 mg by mouth daily.   fluticasone 50 MCG/ACT nasal spray Commonly known as: FLONASE Place 2 sprays into both nostrils daily as needed for allergies or rhinitis.   losartan 100 MG tablet Commonly known as: COZAAR Take 1 tablet (100 mg total) by mouth daily.   multivitamin capsule Take 1 capsule by mouth daily. Nature Made MVI with Omega 3 Gummies-Take 2 daily   psyllium 58.6 % powder Commonly known as: METAMUCIL Take 1 packet by mouth daily.   sildenafil 20 MG tablet Commonly known as: REVATIO Take 3-4 tablets (60-80 mg total) by mouth at bedtime as needed.           Objective:   Physical Exam BP 120/78 (BP Location: Left Arm, Patient Position: Sitting, Cuff Size: Small)   Pulse 90   Temp (!) 97.1 F (36.2 C) (Temporal)   Resp 16   Ht 6\' 2"  (1.88 m)   Wt 189 lb 2 oz (85.8 kg)   SpO2 96%   BMI 24.28 kg/m  General:   Well developed, NAD, BMI  noted. HEENT:  Normocephalic . Face symmetric, atraumatic Lungs:  CTA B Normal respiratory effort, no intercostal retractions, no accessory muscle use. Heart: RRR,  no murmur.  No pretibial edema bilaterally  Skin: Not pale. Not jaundice Neurologic:  alert & oriented X3.  Speech normal, gait appropriate for age and unassisted Psych--  Cognition and judgment appear intact.  Cooperative with normal attention span and concentration.  Behavior appropriate. No anxious or depressed appearing.      Assessment     Assessment HTN Hyperlipidemia BCC, dermatology Dr. Martinique H/o hypogonadism used to see Dr. Chalmers Cater, nl free T 2015 H/o elevated LFTs: Hepatitis B and C negative  PLAN: HTN: No ambulatory BPs good compliance to medication.  Labs and check ambulatory BPs Hyperlipidemia: Diet controlled, current 10-year cardiovascular risk factor is 9.2%.  In the low side.  Rechecking labs. History of BCC: Plans to see dermatology RTC 1 year

## 2019-03-19 NOTE — Assessment & Plan Note (Signed)
HTN: No ambulatory BPs good compliance to medication.  Labs and check ambulatory BPs Hyperlipidemia: Diet controlled, current 10-year cardiovascular risk factor is 9.2%.  In the low side.  Rechecking labs. History of BCC: Plans to see dermatology RTC 1 year

## 2019-05-01 DIAGNOSIS — H524 Presbyopia: Secondary | ICD-10-CM | POA: Diagnosis not present

## 2019-05-01 DIAGNOSIS — H0100A Unspecified blepharitis right eye, upper and lower eyelids: Secondary | ICD-10-CM | POA: Diagnosis not present

## 2019-05-01 DIAGNOSIS — H5213 Myopia, bilateral: Secondary | ICD-10-CM | POA: Diagnosis not present

## 2019-05-01 DIAGNOSIS — H0100B Unspecified blepharitis left eye, upper and lower eyelids: Secondary | ICD-10-CM | POA: Diagnosis not present

## 2019-06-15 ENCOUNTER — Other Ambulatory Visit: Payer: Self-pay

## 2019-06-15 MED ORDER — LOSARTAN POTASSIUM 100 MG PO TABS: 100.0000 mg | ORAL_TABLET | Freq: Every day | ORAL | 2 refills | Status: DC

## 2019-06-15 MED ORDER — AMLODIPINE BESYLATE 5 MG PO TABS: 5.0000 mg | ORAL_TABLET | Freq: Every day | ORAL | 2 refills | Status: DC

## 2019-12-23 ENCOUNTER — Other Ambulatory Visit: Payer: Self-pay | Admitting: Internal Medicine

## 2020-02-25 DIAGNOSIS — Z23 Encounter for immunization: Secondary | ICD-10-CM | POA: Diagnosis not present

## 2020-03-21 ENCOUNTER — Encounter: Payer: BC Managed Care – PPO | Admitting: Internal Medicine

## 2020-03-22 DIAGNOSIS — Z23 Encounter for immunization: Secondary | ICD-10-CM | POA: Diagnosis not present

## 2020-04-19 ENCOUNTER — Other Ambulatory Visit: Payer: Self-pay | Admitting: Internal Medicine

## 2020-04-20 ENCOUNTER — Ambulatory Visit (INDEPENDENT_AMBULATORY_CARE_PROVIDER_SITE_OTHER): Payer: BC Managed Care – PPO | Admitting: Internal Medicine

## 2020-04-20 ENCOUNTER — Other Ambulatory Visit: Payer: Self-pay

## 2020-04-20 ENCOUNTER — Encounter: Payer: Self-pay | Admitting: Internal Medicine

## 2020-04-20 VITALS — BP 162/90 | HR 67 | Temp 98.0°F | Resp 16 | Ht 74.0 in | Wt 176.2 lb

## 2020-04-20 DIAGNOSIS — R634 Abnormal weight loss: Secondary | ICD-10-CM

## 2020-04-20 DIAGNOSIS — Z114 Encounter for screening for human immunodeficiency virus [HIV]: Secondary | ICD-10-CM

## 2020-04-20 DIAGNOSIS — Z23 Encounter for immunization: Secondary | ICD-10-CM | POA: Diagnosis not present

## 2020-04-20 DIAGNOSIS — E785 Hyperlipidemia, unspecified: Secondary | ICD-10-CM | POA: Diagnosis not present

## 2020-04-20 DIAGNOSIS — I10 Essential (primary) hypertension: Secondary | ICD-10-CM | POA: Diagnosis not present

## 2020-04-20 DIAGNOSIS — Z0000 Encounter for general adult medical examination without abnormal findings: Secondary | ICD-10-CM

## 2020-04-20 NOTE — Progress Notes (Signed)
Pre visit review using our clinic review tool, if applicable. No additional management support is needed unless otherwise documented below in the visit note. 

## 2020-04-20 NOTE — Patient Instructions (Signed)
Check the  blood pressure 3 or 4 times a week  BP GOAL is between 110/65 and  135/85. If it is consistently higher or lower, let me know   GO TO THE LAB : Get the blood work     [place], Gary Curry back for a checkup in 3 months

## 2020-04-20 NOTE — Progress Notes (Signed)
Subjective:    Patient ID: Gary Curry, male    DOB: 1961/03/16, 59 y.o.   MRN: [id number]  DOS:  04/20/2020 Type of visit - description: CPX  Since the last office visit he is doing well. Weight was noted to be decreased. Specifically denies fever chills, night sweats.  No diarrhea or stomach problems. Stress is at baseline. Has not changed his lifestyle.  Also BP was noted to be elevated, no recent ambulatory BPs. Good compliance with medicines.  Wt Readings from Last 3 Encounters:  04/20/20 176 lb 4 oz (79.9 kg)  03/18/19 189 lb 2 oz (85.8 kg)  12/19/17 191 lb (86.6 kg)   BP Readings from Last 3 Encounters:  04/20/20 (!) 162/90  03/18/19 120/78  12/19/17 120/66     Review of Systems  Other than above, a 14 point review of systems is negative     Past Medical History:  Diagnosis Date  . Allergy    seasonal allergies  . Basal cell carcinoma 12 years ago   sees derm Dr Martinique  . H/O hypogonadism    used to see Dr Chalmers Cater  . Hyperlipidemia   . Hypertension   . MVP (mitral valve prolapse)    question of.., dx in the 90s    Past Surgical History:  Procedure Laterality Date  . BACK SURGERY  2000/2002   microdiscectomy  . VARICOCELE EXCISION  2011    Allergies as of 04/20/2020   No Known Allergies     Medication List       Accurate as of April 20, 2020 11:59 PM. If you have any questions, ask your nurse or doctor.        amLODipine 5 MG tablet Commonly known as: NORVASC Take 1 tablet (5 mg total) by mouth daily.   cetirizine 10 MG tablet Commonly known as: ZYRTEC Take 10 mg by mouth daily.   fluticasone 50 MCG/ACT nasal spray Commonly known as: FLONASE Place 2 sprays into both nostrils daily as needed for allergies or rhinitis.   losartan 100 MG tablet Commonly known as: COZAAR Take 1 tablet (100 mg total) by mouth daily.   multivitamin capsule Take 1 capsule by mouth daily. Nature Made MVI with Omega 3 Gummies-Take 2 daily     psyllium 58.6 % powder Commonly known as: METAMUCIL Take 1 packet by mouth daily.   sildenafil 20 MG tablet Commonly known as: REVATIO Take 3-4 tablets (60-80 mg total) by mouth at bedtime as needed.          Objective:   Physical Exam BP (!) 162/90 (BP Location: Left Arm, Patient Position: Sitting, Cuff Size: Small)   Pulse 67   Temp 98 F (36.7 C) (Oral)   Resp 16   Ht 6\' 2"  (1.88 m)   Wt 176 lb 4 oz (79.9 kg)   SpO2 100%   BMI 22.63 kg/m  General: Well developed, NAD, BMI noted Neck: No  thyromegaly  HEENT:  Normocephalic . Face symmetric, atraumatic Lungs:  CTA B Normal respiratory effort, no intercostal retractions, no accessory muscle use. Heart: RRR,  no murmur.  Abdomen:  Not distended, soft, non-tender. No rebound or rigidity.   Lower extremities: no pretibial edema bilaterally DRE: Normal sphincter tone, no stools, prostate normal and nontender. Skin: Exposed areas without rash. Not pale. Not jaundice Neurologic:  alert & oriented X3.  Speech normal, gait appropriate for age and unassisted Strength symmetric and appropriate for age.  Psych: Cognition and judgment appear intact.  Cooperative with normal attention span and concentration.  Behavior appropriate. No anxious or depressed appearing.     Assessment      Assessment HTN Hyperlipidemia BCC, dermatology Dr. Martinique H/o hypogonadism used to see Dr. Chalmers Cater, nl free T 2015 H/o elevated LFTs: Hepatitis B and C negative  PLAN: Here for CPX HTN: On losartan, amlodipine.  BP was elevated when he arrived, recheck: 162/90.  Recommend no change, check BPs 3-4 times a week and call with readings in 4 weeks. Hyperlipidemia: Diet controlled Weight loss: Etiology unclear, review of systems negative, no recent changes in lifestyle.  In addition to general labs while on a check specifically a TSH, HIV and asked to come back in 3 months. Social: Still working remotely and enjoying it. RTC 3  months     This visit occurred during the SARS-CoV-2 public health emergency.  Safety protocols were in place, including screening questions prior to the visit, additional usage of staff PPE, and extensive cleaning of exam room while observing appropriate contact time as indicated for disinfecting solutions.

## 2020-04-21 ENCOUNTER — Encounter: Payer: Self-pay | Admitting: Internal Medicine

## 2020-04-21 LAB — COMPREHENSIVE METABOLIC PANEL
AG Ratio: 2.1 (calc) (ref 1.0–2.5)
ALT: 26 U/L (ref 9–46)
AST: 24 U/L (ref 10–35)
Albumin: 4.6 g/dL (ref 3.6–5.1)
Alkaline phosphatase (APISO): 65 U/L (ref 35–144)
BUN: 20 mg/dL (ref 7–25)
CO2: 26 mmol/L (ref 20–32)
Calcium: 9.7 mg/dL (ref 8.6–10.3)
Chloride: 104 mmol/L (ref 98–110)
Creat: 0.98 mg/dL (ref 0.70–1.33)
Globulin: 2.2 g/dL (calc) (ref 1.9–3.7)
Glucose, Bld: 82 mg/dL (ref 65–99)
Potassium: 4.8 mmol/L (ref 3.5–5.3)
Sodium: 140 mmol/L (ref 135–146)
Total Bilirubin: 0.8 mg/dL (ref 0.2–1.2)
Total Protein: 6.8 g/dL (ref 6.1–8.1)

## 2020-04-21 LAB — CBC WITH DIFFERENTIAL/PLATELET
Absolute Monocytes: 422 cells/uL (ref 200–950)
Basophils Absolute: 41 cells/uL (ref 0–200)
Basophils Relative: 0.6 %
Eosinophils Absolute: 109 cells/uL (ref 15–500)
Eosinophils Relative: 1.6 %
HCT: 46.3 % (ref 38.5–50.0)
Hemoglobin: 15.8 g/dL (ref 13.2–17.1)
Lymphs Abs: 1829 cells/uL (ref 850–3900)
MCH: 30.6 pg (ref 27.0–33.0)
MCHC: 34.1 g/dL (ref 32.0–36.0)
MCV: 89.6 fL (ref 80.0–100.0)
MPV: 9.8 fL (ref 7.5–12.5)
Monocytes Relative: 6.2 %
Neutro Abs: 4400 cells/uL (ref 1500–7800)
Neutrophils Relative %: 64.7 %
Platelets: 233 10*3/uL (ref 140–400)
RBC: 5.17 10*6/uL (ref 4.20–5.80)
RDW: 12.6 % (ref 11.0–15.0)
Total Lymphocyte: 26.9 %
WBC: 6.8 10*3/uL (ref 3.8–10.8)

## 2020-04-21 LAB — LIPID PANEL
Cholesterol: 209 mg/dL — ABNORMAL HIGH (ref <200)
HDL: 42 mg/dL (ref >=40)
LDL Cholesterol (Calc): 142 mg/dL (calc) — ABNORMAL HIGH
Non-HDL Cholesterol (Calc): 167 mg/dL (calc) — ABNORMAL HIGH (ref <130)
Total CHOL/HDL Ratio: 5 (calc) — ABNORMAL HIGH (ref <5.0)
Triglycerides: 128 mg/dL (ref <150)

## 2020-04-21 LAB — HEMOGLOBIN A1C
Hgb A1c MFr Bld: 5.3 % of total Hgb (ref <5.7)
Mean Plasma Glucose: 105 (calc)
eAG (mmol/L): 5.8 (calc)

## 2020-04-21 LAB — HIV ANTIBODY (ROUTINE TESTING W REFLEX): HIV 1&2 Ab, 4th Generation: NONREACTIVE

## 2020-04-21 LAB — TSH: TSH: 0.98 mIU/L (ref 0.40–4.50)

## 2020-04-21 LAB — PSA: PSA: 3.03 ng/mL (ref <=4.0)

## 2020-04-21 NOTE — Assessment & Plan Note (Signed)
Here for CPX HTN: On losartan, amlodipine.  BP was elevated when he arrived, recheck: 162/90.  Recommend no change, check BPs 3-4 times a week and call with readings in 4 weeks. Hyperlipidemia: Diet controlled Weight loss: Etiology unclear, review of systems negative, no recent changes in lifestyle.  In addition to general labs while on a check specifically a TSH, HIV and asked to come back in 3 months. Social: Still working remotely and enjoying it. RTC 3 months

## 2020-04-21 NOTE — Assessment & Plan Note (Signed)
-  Td 2016 - shingrex discussed before - last COVID vaccination  02-2020, recommend booster by 08-2020. - flu shot today -- CCS: Cscope 06-2012,  2 polyps; had a  cscope 11/2017, next per GI -- prostate ca screening:  DRE wnl check PSA today --Die-exercise: No recent changes, doing well.  Eat healthy, physical activity about the same as before or slightly less. --Labs: CMP, FLP, CBC, A1c, TSH, PSA, HIV

## 2020-07-21 ENCOUNTER — Ambulatory Visit: Payer: BC Managed Care – PPO | Admitting: Internal Medicine

## 2020-07-21 ENCOUNTER — Encounter: Payer: Self-pay | Admitting: Internal Medicine

## 2020-07-25 ENCOUNTER — Other Ambulatory Visit: Payer: Self-pay | Admitting: Internal Medicine

## 2020-07-26 ENCOUNTER — Telehealth: Payer: Self-pay | Admitting: Internal Medicine

## 2020-07-26 NOTE — Telephone Encounter (Signed)
Medication: amLODipine (NORVASC) 5 MG tablet [[id number]]   losartan (COZAAR) 100 MG tablet [[id number]]      Has the patient contacted their pharmacy? no (If no, request that the patient contact the pharmacy for the refill.) (If yes, when and what did the pharmacy advise?)    Preferred Pharmacy (with phone number or street name):   [id number] - [place], [address]  [place], [place] [address]  Phone:  [phone] Fax:  [phone]     Agent: Please be advised that RX refills may take up to 3 business days. We ask that you follow-up with your pharmacy.

## 2020-07-26 NOTE — Telephone Encounter (Signed)
Refills were sent yesterday, 07/25/2020- he does need a follow-up appt this month please. Please schedule at his convenience.

## 2020-07-28 ENCOUNTER — Other Ambulatory Visit: Payer: Self-pay

## 2020-07-28 MED ORDER — LOSARTAN POTASSIUM 100 MG PO TABS: 100.0000 mg | ORAL_TABLET | Freq: Every day | ORAL | 2 refills | Status: DC

## 2020-07-28 MED ORDER — AMLODIPINE BESYLATE 5 MG PO TABS
5.0000 mg | ORAL_TABLET | Freq: Every day | ORAL | 2 refills | Status: DC
Start: 2020-07-28 — End: 2021-05-05

## 2020-07-28 NOTE — Telephone Encounter (Signed)
He did send a message saying that he was doing well. Okay to refill 90 days and 2 RFs

## 2020-07-28 NOTE — Telephone Encounter (Signed)
Per Dr Larose Kells refills sent into pt pharmacy. -JMA

## 2020-07-28 NOTE — Telephone Encounter (Signed)
Patient called in reference to medication refill, patient state he would a 90 day supply of losartan (COZAAR) 100 MG tablet and amLODipine (NORVASC) 5 MG tablet. Patient advise that he will need a follow up visit, patient states he sent a message to Dr Larose Kells in January 2022 and everything was back to normal with blood pressure and weight from October 2021. Patient declined the appointment at this time.    Please advise if 90 day supply of medication will be given

## 2020-07-28 NOTE — Telephone Encounter (Signed)
Please advise. Pt overdue for visit- he refuses to schedule an appt.

## 2020-07-29 NOTE — Telephone Encounter (Signed)
Error

## 2020-09-08 ENCOUNTER — Encounter: Payer: Self-pay | Admitting: Internal Medicine

## 2020-11-29 ENCOUNTER — Telehealth: Payer: Self-pay | Admitting: Internal Medicine

## 2020-11-29 ENCOUNTER — Other Ambulatory Visit: Payer: Self-pay

## 2020-11-29 MED ORDER — SILDENAFIL CITRATE 20 MG PO TABS
60.0000 mg | ORAL_TABLET | Freq: Every evening | ORAL | 0 refills | Status: DC | PRN
Start: 2020-11-29 — End: 2021-06-27

## 2020-11-29 NOTE — Telephone Encounter (Signed)
Pt overdue for visit- (See AVS from 03/2020)- Pt was to f/u in 3 months. No refills w/o visit.

## 2020-11-29 NOTE — Telephone Encounter (Signed)
Medication:sildenafil (REVATIO) 20 MG tablet   Has the patient contacted their pharmacy? Yes.   (If no, request that the patient contact the pharmacy for the refill.) (If yes, when and what did the pharmacy advise?) call pcp    Preferred Pharmacy (with phone number or street name):  [facility] [place], [place] - [address] AT [address] Phone:  [phone]  Fax:  [phone]       Agent: Please be advised that RX refills may take up to 3 business days. We ask that you follow-up with your pharmacy.

## 2020-11-30 NOTE — Telephone Encounter (Signed)
Done

## 2021-02-14 ENCOUNTER — Other Ambulatory Visit: Payer: Self-pay | Admitting: Internal Medicine

## 2021-03-10 DIAGNOSIS — H9313 Tinnitus, bilateral: Secondary | ICD-10-CM | POA: Diagnosis not present

## 2021-03-10 DIAGNOSIS — H903 Sensorineural hearing loss, bilateral: Secondary | ICD-10-CM | POA: Diagnosis not present

## 2021-03-29 DIAGNOSIS — H1089 Other conjunctivitis: Secondary | ICD-10-CM | POA: Diagnosis not present

## 2021-03-29 DIAGNOSIS — H0288B Meibomian gland dysfunction left eye, upper and lower eyelids: Secondary | ICD-10-CM | POA: Diagnosis not present

## 2021-03-29 DIAGNOSIS — H0288A Meibomian gland dysfunction right eye, upper and lower eyelids: Secondary | ICD-10-CM | POA: Diagnosis not present

## 2021-05-03 ENCOUNTER — Ambulatory Visit (INDEPENDENT_AMBULATORY_CARE_PROVIDER_SITE_OTHER): Payer: BC Managed Care – PPO | Admitting: Internal Medicine

## 2021-05-03 ENCOUNTER — Other Ambulatory Visit: Payer: Self-pay

## 2021-05-03 ENCOUNTER — Encounter: Payer: Self-pay | Admitting: Internal Medicine

## 2021-05-03 VITALS — BP 138/84 | HR 73 | Temp 97.7°F | Resp 18 | Ht 74.0 in | Wt 189.4 lb

## 2021-05-03 DIAGNOSIS — Z23 Encounter for immunization: Secondary | ICD-10-CM | POA: Diagnosis not present

## 2021-05-03 DIAGNOSIS — E785 Hyperlipidemia, unspecified: Secondary | ICD-10-CM

## 2021-05-03 DIAGNOSIS — Z0000 Encounter for general adult medical examination without abnormal findings: Secondary | ICD-10-CM

## 2021-05-03 LAB — CBC WITH DIFFERENTIAL/PLATELET
Basophils Absolute: 0 10*3/uL (ref 0.0–0.1)
Basophils Relative: 0.7 % (ref 0.0–3.0)
Eosinophils Absolute: 0.1 10*3/uL (ref 0.0–0.7)
Eosinophils Relative: 1.3 % (ref 0.0–5.0)
HCT: 42.8 % (ref 39.0–52.0)
Hemoglobin: 14.6 g/dL (ref 13.0–17.0)
Lymphocytes Relative: 23.6 % (ref 12.0–46.0)
Lymphs Abs: 1.5 10*3/uL (ref 0.7–4.0)
MCHC: 34 g/dL (ref 30.0–36.0)
MCV: 89.1 fl (ref 78.0–100.0)
Monocytes Absolute: 0.5 10*3/uL (ref 0.1–1.0)
Monocytes Relative: 8.5 % (ref 3.0–12.0)
Neutro Abs: 4.1 10*3/uL (ref 1.4–7.7)
Neutrophils Relative %: 65.9 % (ref 43.0–77.0)
Platelets: 217 10*3/uL (ref 150.0–400.0)
RBC: 4.8 Mil/uL (ref 4.22–5.81)
RDW: 12.6 % (ref 11.5–15.5)
WBC: 6.2 10*3/uL (ref 4.0–10.5)

## 2021-05-03 LAB — COMPREHENSIVE METABOLIC PANEL
ALT: 62 U/L — ABNORMAL HIGH (ref 0–53)
AST: 58 U/L — ABNORMAL HIGH (ref 0–37)
Albumin: 4.4 g/dL (ref 3.5–5.2)
Alkaline Phosphatase: 60 U/L (ref 39–117)
BUN: 17 mg/dL (ref 6–23)
CO2: 28 mEq/L (ref 19–32)
Calcium: 9.3 mg/dL (ref 8.4–10.5)
Chloride: 105 mEq/L (ref 96–112)
Creatinine, Ser: 0.87 mg/dL (ref 0.40–1.50)
GFR: 94.82 mL/min (ref >60.00)
Glucose, Bld: 91 mg/dL (ref 70–99)
Potassium: 4 mEq/L (ref 3.5–5.1)
Sodium: 140 mEq/L (ref 135–145)
Total Bilirubin: 1.1 mg/dL (ref 0.2–1.2)
Total Protein: 6.7 g/dL (ref 6.0–8.3)

## 2021-05-03 LAB — LIPID PANEL
Cholesterol: 205 mg/dL — ABNORMAL HIGH (ref 0–200)
HDL: 39.5 mg/dL (ref >39.00)
LDL Cholesterol: 148 mg/dL — ABNORMAL HIGH (ref 0–99)
NonHDL: 165.74
Total CHOL/HDL Ratio: 5
Triglycerides: 90 mg/dL (ref 0.0–149.0)
VLDL: 18 mg/dL (ref 0.0–40.0)

## 2021-05-03 LAB — PSA: PSA: 2.49 ng/mL (ref 0.10–4.00)

## 2021-05-03 NOTE — Progress Notes (Signed)
Subjective:    Patient ID: Gary Curry, male    DOB: 01-28-1961, 60 y.o.   MRN: [id number]  DOS:  05/03/2021 Type of visit - description: CPX Since the last office visit he is doing well. The last time he was here he was concerned about weight loss, that self  corrected.  Wt Readings from Last 3 Encounters:  05/03/21 189 lb 6.4 oz (85.9 kg)  04/20/20 176 lb 4 oz (79.9 kg)  03/18/19 189 lb 2 oz (85.8 kg)    Review of Systems  Other than above, a 14 point review of systems is negative       Past Medical History:  Diagnosis Date   Allergy    seasonal allergies   Basal cell carcinoma 12 years ago   sees derm Dr Martinique   H/O hypogonadism    used to see Dr Chalmers Cater   Hyperlipidemia    Hypertension    MVP (mitral valve prolapse)    question of.., dx in the 90s    Past Surgical History:  Procedure Laterality Date   BACK SURGERY  2000/2002   microdiscectomy   VARICOCELE EXCISION  2011   Social History   Socioeconomic History   Marital status: Married    Spouse name: [name]   Number of children: 0   Years of education: Not on file   Highest education level: Not on file  Occupational History   Occupation: Scientist, forensic for [employer]  Tobacco Use   Smoking status: Never   Smokeless tobacco: Never  Substance and Sexual Activity   Alcohol use: Not Currently   Drug use: No   Sexual activity: Not on file  Other Topics Concern   Not on file  Social History Narrative   Household-- pt and wife [name]    Social Determinants of Health   Financial Resource Strain: Not on file  Food Insecurity: Not on file  Transportation Needs: Not on file  Physical Activity: Not on file  Stress: Not on file  Social Connections: Not on file  Intimate Partner Violence: Not on file    Allergies as of 05/03/2021   No Known Allergies      Medication List        Accurate as of May 03, 2021 11:59 PM. If you have any questions, ask your nurse or doctor.           amLODipine 5 MG tablet Commonly known as: NORVASC Take 1 tablet (5 mg total) by mouth daily.   cetirizine 10 MG tablet Commonly known as: ZYRTEC Take 10 mg by mouth daily.   fluticasone 50 MCG/ACT nasal spray Commonly known as: FLONASE SHAKE LIQUID AND USE 2 SPRAYS IN EACH NOSTRIL DAILY AS NEEDED FOR ALLERGIES OR RHINITIS   losartan 100 MG tablet Commonly known as: COZAAR Take 1 tablet (100 mg total) by mouth daily.   multivitamin capsule Take 1 capsule by mouth daily. Nature Made MVI with Omega 3 Gummies-Take 2 daily   psyllium 58.6 % powder Commonly known as: METAMUCIL Take 1 packet by mouth daily.   sildenafil 20 MG tablet Commonly known as: REVATIO Take 3-4 tablets (60-80 mg total) by mouth at bedtime as needed.           Objective:   Physical Exam BP 138/84 (BP Location: Left Arm, Patient Position: Sitting, Cuff Size: Normal)   Pulse 73   Temp 97.7 F (36.5 C) (Oral)   Resp 18   Ht 6\' 2"  (1.88 m)  Wt 189 lb 6.4 oz (85.9 kg)   SpO2 100%   BMI 24.32 kg/m  General: Well developed, NAD, BMI noted Neck: No  thyromegaly  HEENT:  Normocephalic . Face symmetric, atraumatic Lungs:  CTA B Normal respiratory effort, no intercostal retractions, no accessory muscle use. Heart: RRR,  no murmur.  Abdomen:  Not distended, soft, non-tender. No rebound or rigidity.   Lower extremities: no pretibial edema bilaterally DRE: Normal sphincter tone, no stools, prostate normal. Skin: Exposed areas without rash. Not pale. Not jaundice Neurologic:  alert & oriented X3.  Speech normal, gait appropriate for age and unassisted Strength symmetric and appropriate for age.  Psych: Cognition and judgment appear intact.  Cooperative with normal attention span and concentration.  Behavior appropriate. No anxious or depressed appearing.     Assessment     Assessment HTN Hyperlipidemia BCC, dermatology Dr. Martinique H/o hypogonadism used to see Dr. Chalmers Cater, nl free T  2015 H/o elevated LFTs: Hepatitis B and C negative HOH, mild, saw ENT 02/2021   PLAN: Here for CPX HTN: BP is very good, continue same medications Hyperlipidemia: Diet controlled, current CV RF at 10 years is 11.5, patient is aware, he qualifies for statins.  We will wait for the new FLP. RTC 1 year   This visit occurred during the SARS-CoV-2 public health emergency.  Safety protocols were in place, including screening questions prior to the visit, additional usage of staff PPE, and extensive cleaning of exam room while observing appropriate contact time as indicated for disinfecting solutions.

## 2021-05-03 NOTE — Patient Instructions (Signed)
To calculate your own cardiovascular risk factor in 10 years  use the following tool  http://www.cvriskcalculator.com/   Check the  blood pressure regularly BP GOAL is between 110/65 and  135/85. If it is consistently higher or lower, let me know  Vaccines are recommended: COVID-vaccine Shingrix is optional  GO TO THE LAB : Get the blood work     [place], [place] back for   a physical exam in 1 year     "Living will", "[place] of attorney": Advanced care planning  (If you already have a living will or healthcare power of attorney, please bring the copy to be scanned in your chart.)  Advance care planning is a process that supports adults in  understanding and sharing their preferences regarding future medical care.   The patient's preferences are recorded in documents called Advance Directives.    Advanced directives are completed (and can be modified at any time) while the patient is in full mental capacity.   The documentation should be available at all times to the patient, the family and the healthcare providers.  Bring in a copy to be scanned in your chart is an excellent idea and is recommended   This legal documents direct treatment decision making and/or appoint a surrogate to make the decision if the patient is not capable to do so.    Advance directives can be documented in many types of formats,  documents have names such as:  Lliving will  Durable power of attorney for healthcare (healthcare proxy or healthcare power of attorney)  Combined directives  Physician orders for life-sustaining treatment    More information at:  [email or web address]

## 2021-05-04 ENCOUNTER — Encounter: Payer: Self-pay | Admitting: Internal Medicine

## 2021-05-04 DIAGNOSIS — D225 Melanocytic nevi of trunk: Secondary | ICD-10-CM | POA: Diagnosis not present

## 2021-05-04 DIAGNOSIS — L218 Other seborrheic dermatitis: Secondary | ICD-10-CM | POA: Diagnosis not present

## 2021-05-04 DIAGNOSIS — L821 Other seborrheic keratosis: Secondary | ICD-10-CM | POA: Diagnosis not present

## 2021-05-04 DIAGNOSIS — L814 Other melanin hyperpigmentation: Secondary | ICD-10-CM | POA: Diagnosis not present

## 2021-05-04 NOTE — Assessment & Plan Note (Signed)
Here for CPX HTN: BP is very good, continue same medications Hyperlipidemia: Diet controlled, current CV RF at 10 years is 11.5, patient is aware, he qualifies for statins.  We will wait for the new FLP. RTC 1 year

## 2021-05-04 NOTE — Assessment & Plan Note (Signed)
-  Td 2016 - shingrex discussed today -  COVID vaccination: bivalent shot  rec, plans to proceed  -Had a flu shot recently -- CCS: Cscope 06-2012,  2 polyps; had a  cscope 11/2017, next 5 years per GI letter  -- prostate ca screening:  DRE normal, check PSA.  No symptoms --Die-exercise: Eats mostly healthy, needs to exercise more, counseled - Labs: CMP,FLP, CBC, PSA - POA d/w

## 2021-05-05 ENCOUNTER — Other Ambulatory Visit: Payer: Self-pay | Admitting: Internal Medicine

## 2021-05-09 MED ORDER — ATORVASTATIN CALCIUM 20 MG PO TABS: 20.0000 mg | ORAL_TABLET | Freq: Every day | ORAL | 3 refills | Status: DC

## 2021-05-09 NOTE — Addendum Note (Signed)
Addended byDamita Dunnings D on: 05/09/2021 09:46 AM   Modules accepted: Orders

## 2021-05-22 ENCOUNTER — Telehealth: Payer: Self-pay

## 2021-05-22 DIAGNOSIS — E785 Hyperlipidemia, unspecified: Secondary | ICD-10-CM

## 2021-05-22 NOTE — Telephone Encounter (Signed)
Okay,  will see how his blood work come back.

## 2021-05-22 NOTE — Telephone Encounter (Signed)
Send atorvastatin 20 mg 1 p.o. nightly #30 and 3 refills Future labs: FLP, AST, ALT in 6 weeks === (The 10-year ASCVD risk score (Arnett DK, et al., 2019) is: 11.9%) Slightly increased LFTs noted, starting on atorvastatin, follow-up 6 weeks   Pt called regarding labs from 05/03/21- he wanted to wait to start cholesterol medication and recheck labs in 6 months after working on diet. I have made lab appt for 11/22/21 at [place].

## 2021-06-27 ENCOUNTER — Other Ambulatory Visit: Payer: Self-pay | Admitting: Internal Medicine

## 2021-07-07 ENCOUNTER — Other Ambulatory Visit (INDEPENDENT_AMBULATORY_CARE_PROVIDER_SITE_OTHER): Payer: BC Managed Care – PPO

## 2021-07-07 DIAGNOSIS — E785 Hyperlipidemia, unspecified: Secondary | ICD-10-CM

## 2021-07-07 LAB — ALT: ALT: 30 U/L (ref 0–53)

## 2021-07-07 LAB — AST: AST: 21 U/L (ref 0–37)

## 2021-11-22 ENCOUNTER — Other Ambulatory Visit (INDEPENDENT_AMBULATORY_CARE_PROVIDER_SITE_OTHER): Payer: BC Managed Care – PPO

## 2021-11-22 DIAGNOSIS — E785 Hyperlipidemia, unspecified: Secondary | ICD-10-CM | POA: Diagnosis not present

## 2021-11-22 LAB — LIPID PANEL
Cholesterol: 206 mg/dL — ABNORMAL HIGH (ref 0–200)
HDL: 38.7 mg/dL — ABNORMAL LOW (ref >39.00)
LDL Cholesterol: 137 mg/dL — ABNORMAL HIGH (ref 0–99)
NonHDL: 167.38
Total CHOL/HDL Ratio: 5
Triglycerides: 151 mg/dL — ABNORMAL HIGH (ref 0.0–149.0)
VLDL: 30.2 mg/dL (ref 0.0–40.0)

## 2022-01-18 ENCOUNTER — Other Ambulatory Visit: Payer: Self-pay | Admitting: Internal Medicine

## 2022-04-10 ENCOUNTER — Ambulatory Visit (INDEPENDENT_AMBULATORY_CARE_PROVIDER_SITE_OTHER): Payer: BC Managed Care – PPO | Admitting: Internal Medicine

## 2022-04-10 ENCOUNTER — Encounter: Payer: Self-pay | Admitting: Internal Medicine

## 2022-04-10 VITALS — BP 132/84 | HR 82 | Temp 97.7°F | Resp 16 | Ht 74.0 in | Wt 187.4 lb

## 2022-04-10 DIAGNOSIS — Z125 Encounter for screening for malignant neoplasm of prostate: Secondary | ICD-10-CM | POA: Diagnosis not present

## 2022-04-10 DIAGNOSIS — E785 Hyperlipidemia, unspecified: Secondary | ICD-10-CM

## 2022-04-10 DIAGNOSIS — Z0000 Encounter for general adult medical examination without abnormal findings: Secondary | ICD-10-CM

## 2022-04-10 DIAGNOSIS — I10 Essential (primary) hypertension: Secondary | ICD-10-CM | POA: Diagnosis not present

## 2022-04-10 DIAGNOSIS — Z23 Encounter for immunization: Secondary | ICD-10-CM

## 2022-04-10 LAB — CBC WITH DIFFERENTIAL/PLATELET
Basophils Absolute: 0.1 10*3/uL (ref 0.0–0.1)
Basophils Relative: 0.7 % (ref 0.0–3.0)
Eosinophils Absolute: 0 10*3/uL (ref 0.0–0.7)
Eosinophils Relative: 0.7 % (ref 0.0–5.0)
HCT: 45.2 % (ref 39.0–52.0)
Hemoglobin: 15.6 g/dL (ref 13.0–17.0)
Lymphocytes Relative: 22.8 % (ref 12.0–46.0)
Lymphs Abs: 1.6 10*3/uL (ref 0.7–4.0)
MCHC: 34.4 g/dL (ref 30.0–36.0)
MCV: 88.7 fl (ref 78.0–100.0)
Monocytes Absolute: 0.5 10*3/uL (ref 0.1–1.0)
Monocytes Relative: 6.4 % (ref 3.0–12.0)
Neutro Abs: 4.9 10*3/uL (ref 1.4–7.7)
Neutrophils Relative %: 69.4 % (ref 43.0–77.0)
Platelets: 218 10*3/uL (ref 150.0–400.0)
RBC: 5.1 Mil/uL (ref 4.22–5.81)
RDW: 12.7 % (ref 11.5–15.5)
WBC: 7.1 10*3/uL (ref 4.0–10.5)

## 2022-04-10 LAB — COMPREHENSIVE METABOLIC PANEL
ALT: 36 U/L (ref 0–53)
AST: 24 U/L (ref 0–37)
Albumin: 4.7 g/dL (ref 3.5–5.2)
Alkaline Phosphatase: 69 U/L (ref 39–117)
BUN: 19 mg/dL (ref 6–23)
CO2: 28 mEq/L (ref 19–32)
Calcium: 9.7 mg/dL (ref 8.4–10.5)
Chloride: 103 mEq/L (ref 96–112)
Creatinine, Ser: 0.85 mg/dL (ref 0.40–1.50)
GFR: 94.86 mL/min (ref >60.00)
Glucose, Bld: 87 mg/dL (ref 70–99)
Potassium: 4.2 mEq/L (ref 3.5–5.1)
Sodium: 139 mEq/L (ref 135–145)
Total Bilirubin: 1 mg/dL (ref 0.2–1.2)
Total Protein: 7.1 g/dL (ref 6.0–8.3)

## 2022-04-10 LAB — LIPID PANEL
Cholesterol: 220 mg/dL — ABNORMAL HIGH (ref 0–200)
HDL: 43.2 mg/dL (ref >39.00)
LDL Cholesterol: 143 mg/dL — ABNORMAL HIGH (ref 0–99)
NonHDL: 176.94
Total CHOL/HDL Ratio: 5
Triglycerides: 171 mg/dL — ABNORMAL HIGH (ref 0.0–149.0)
VLDL: 34.2 mg/dL (ref 0.0–40.0)

## 2022-04-10 LAB — PSA: PSA: 2.31 ng/mL (ref 0.10–4.00)

## 2022-04-10 NOTE — Assessment & Plan Note (Signed)
-  Td 2016 - shingrex d/w pt  -  COVID vax booster rec, he seems in favor   - flu shot today -- CCS: Cscope 06-2012,  2 polyps; had a  cscope 11/2017, next 5 years per GI letter  -- prostate ca screening:  DRE normal last year, no SX, check PSA --Die-exercise: Doing great, walks 15 miles a week, very healthy diet. - Labs: CMP,FLP, CBC, PSA - POA: See AVS

## 2022-04-10 NOTE — Progress Notes (Signed)
Subjective:    Patient ID: Gary Curry, male    DOB: [birth year], 61 y.o.   MRN: [id number]  DOS:  04/10/2022 Type of visit - description: CPX  Here for CPX.  Doing well. Doing great with lifestyle.  Review of Systems  A 14 point review of systems is negative    Past Medical History:  Diagnosis Date   Allergy    seasonal allergies   Basal cell carcinoma 12 years ago   sees derm Dr Martinique   H/O hypogonadism    used to see Dr Chalmers Cater   Hyperlipidemia    Hypertension    MVP (mitral valve prolapse)    question of.., dx in the 90s    Past Surgical History:  Procedure Laterality Date   BACK SURGERY  2000/2002   microdiscectomy   VARICOCELE EXCISION  2011   Social History   Socioeconomic History   Marital status: Married    Spouse name: [name]   Number of children: 0   Years of education: Not on file   Highest education level: Not on file  Occupational History   Occupation: Scientist, forensic for [employer]  Tobacco Use   Smoking status: Never   Smokeless tobacco: Never  Substance and Sexual Activity   Alcohol use: Not Currently   Drug use: No   Sexual activity: Not on file  Other Topics Concern   Not on file  Social History Narrative   Household-- pt and wife [name]    Social Determinants of Health   Financial Resource Strain: Not on file  Food Insecurity: Not on file  Transportation Needs: Not on file  Physical Activity: Not on file  Stress: Not on file  Social Connections: Not on file  Intimate Partner Violence: Not on file     Current Outpatient Medications  Medication Instructions   amLODipine (NORVASC) 5 MG tablet TAKE 1 TABLET(5 MG) BY MOUTH DAILY   cetirizine (ZYRTEC) 10 mg, Oral, Daily   fluticasone (FLONASE) 50 MCG/ACT nasal spray SHAKE LIQUID AND USE 2 SPRAYS IN EACH NOSTRIL DAILY AS NEEDED FOR ALLERGIES OR RHINITIS   losartan (COZAAR) 100 MG tablet TAKE 1 TABLET(100 MG) BY MOUTH DAILY   Multiple Vitamin (MULTIVITAMIN) capsule 1 capsule,  Oral, Daily, Nature Made MVI with Omega 3 Gummies-Take 2 daily   psyllium (METAMUCIL) 58.6 % powder 1 packet, Oral, Daily   sildenafil (REVATIO) 20 MG tablet TAKE 3 TO 4 TABLETS(60 TO 80 MG) BY MOUTH AT BEDTIME AS NEEDED       Objective:   Physical Exam BP 132/84   Pulse 82   Temp 97.7 F (36.5 C) (Oral)   Resp 16   Ht '6\' 2"'$  (1.88 m)   Wt 187 lb 6 oz (85 kg)   SpO2 98%   BMI 24.06 kg/m  General: Well developed, NAD, BMI noted Neck: No  thyromegaly  HEENT:  Normocephalic . Face symmetric, atraumatic Lungs:  CTA B Normal respiratory effort, no intercostal retractions, no accessory muscle use. Heart: RRR,  no murmur.  Abdomen:  Not distended, soft, non-tender. No rebound or rigidity.   Lower extremities: no pretibial edema bilaterally  Skin: Exposed areas without rash. Not pale. Not jaundice Neurologic:  alert & oriented X3.  Speech normal, gait appropriate for age and unassisted Strength symmetric and appropriate for age.  Psych: Cognition and judgment appear intact.  Cooperative with normal attention span and concentration.  Behavior appropriate. No anxious or depressed appearing.     Assessment  Assessment HTN Hyperlipidemia BCC, dermatology Dr. Martinique H/o hypogonadism used to see Dr. Chalmers Cater, nl free T 2015 H/o elevated LFTs: Hepatitis B and C negative HOH, mild, saw ENT 02/2021   PLAN: Here for CPX HTN:BP is very good, recommend to check ambulatory BPs, continue amlodipine, losartan.  Checking labs Hyperlipidemia: LDL November 2022 was 148, CV RF at 10 years was elevated, was Rx atorvastatin, elected to wait and work on his lifestyle.  He is doing great, has a healthy diet, walks 15 miles a week. D/w patient option of getting a calcium coronary score that will help him decide if he needs (or not) cholesterol medication.  Plan: FLP, consider CCS. RTC 4 months

## 2022-04-10 NOTE — Patient Instructions (Addendum)
Congratulations, you are doing great with your lifestyle.  Vaccines I recommend:  Covid booster Shingrix (shingles)  Check the  blood pressure regularly BP GOAL is between 110/65 and  135/85. If it is consistently higher or lower, let me know     GO TO THE LAB : Get the blood work     [place], [place] back for checkup in 4 months   Advanced care planning:  Do you have a "Living will", "Advance of attorney" ?   If you already have a living will or healthcare power of attorney, is recommended you bring the copy to be scanned in your chart. The document will be available to all the doctors you see in the system.  If you don't have one, please consider create one.  Advance care planning is a process that supports adults in  understanding and sharing their preferences regarding future medical care.   The patient's preferences are recorded in documents called Advance Directives.    Advanced directives are completed (and can be modified at any time) while the patient is in full mental capacity.   The documentation should be available at all times to the patient, the family and the healthcare providers.   This legal documents direct treatment decision making and/or appoint a surrogate to make the decision if the patient is not capable to do so.    Advance directives can be documented in many types of formats,  documents have names such as:  Lliving will  Durable power of attorney for healthcare (healthcare proxy or healthcare power of attorney)  Combined directives  Physician orders for life-sustaining treatment    More information at:  [email or web address]

## 2022-04-10 NOTE — Assessment & Plan Note (Signed)
Here for CPX HTN:BP is very good, recommend to check ambulatory BPs, continue amlodipine, losartan.  Checking labs Hyperlipidemia: LDL November 2022 was 148, CV RF at 10 years was elevated, was Rx atorvastatin, elected to wait and work on his lifestyle.  He is doing great, has a healthy diet, walks 15 miles a week. D/w patient option of getting a calcium coronary score that will help him decide if he needs (or not) cholesterol medication.  Plan: FLP, consider CCS. RTC 4 months

## 2022-04-14 ENCOUNTER — Other Ambulatory Visit: Payer: Self-pay | Admitting: Internal Medicine

## 2022-04-16 ENCOUNTER — Other Ambulatory Visit: Payer: Self-pay | Admitting: Internal Medicine

## 2022-05-02 ENCOUNTER — Encounter: Payer: Self-pay | Admitting: Internal Medicine

## 2022-05-02 DIAGNOSIS — E785 Hyperlipidemia, unspecified: Secondary | ICD-10-CM

## 2022-07-06 ENCOUNTER — Other Ambulatory Visit (HOSPITAL_BASED_OUTPATIENT_CLINIC_OR_DEPARTMENT_OTHER): Payer: BC Managed Care – PPO

## 2022-07-13 ENCOUNTER — Other Ambulatory Visit: Payer: Self-pay | Admitting: Internal Medicine

## 2022-08-02 ENCOUNTER — Ambulatory Visit (HOSPITAL_BASED_OUTPATIENT_CLINIC_OR_DEPARTMENT_OTHER)
Admission: RE | Admit: 2022-08-02 | Discharge: 2022-08-02 | Disposition: A | Discharge status: Home / Self Care | Payer: BC Managed Care – PPO | Source: Ambulatory Visit | Attending: Internal Medicine | Admitting: Internal Medicine

## 2022-08-02 DIAGNOSIS — E785 Hyperlipidemia, unspecified: Secondary | ICD-10-CM | POA: Insufficient documentation

## 2022-08-09 MED ORDER — ATORVASTATIN CALCIUM 20 MG PO TABS: 20.0000 mg | ORAL_TABLET | Freq: Every day | ORAL | 0 refills | Status: DC

## 2022-08-10 ENCOUNTER — Telehealth: Payer: Self-pay | Admitting: Internal Medicine

## 2022-08-10 NOTE — Telephone Encounter (Signed)
LVM for pt to call back and schedule a 6 week f/u with Dr. Larose Kells.

## 2022-08-14 ENCOUNTER — Ambulatory Visit: Payer: BC Managed Care – PPO | Admitting: Internal Medicine

## 2022-09-20 ENCOUNTER — Encounter: Payer: Self-pay | Admitting: Internal Medicine

## 2022-09-20 ENCOUNTER — Ambulatory Visit: Payer: BC Managed Care – PPO | Admitting: Internal Medicine

## 2022-09-20 VITALS — BP 132/78 | HR 77 | Temp 98.1°F | Resp 16 | Ht 74.0 in | Wt 188.1 lb

## 2022-09-20 DIAGNOSIS — E785 Hyperlipidemia, unspecified: Secondary | ICD-10-CM | POA: Diagnosis not present

## 2022-09-20 DIAGNOSIS — I10 Essential (primary) hypertension: Secondary | ICD-10-CM

## 2022-09-20 LAB — BASIC METABOLIC PANEL
BUN: 21 mg/dL (ref 6–23)
CO2: 26 mEq/L (ref 19–32)
Calcium: 9.7 mg/dL (ref 8.4–10.5)
Chloride: 106 mEq/L (ref 96–112)
Creatinine, Ser: 0.98 mg/dL (ref 0.40–1.50)
GFR: 83.91 mL/min (ref >60.00)
Glucose, Bld: 92 mg/dL (ref 70–99)
Potassium: 4.3 mEq/L (ref 3.5–5.1)
Sodium: 139 mEq/L (ref 135–145)

## 2022-09-20 LAB — AST: AST: 22 U/L (ref 0–37)

## 2022-09-20 LAB — LIPID PANEL
Cholesterol: 138 mg/dL (ref 0–200)
HDL: 43.2 mg/dL (ref >39.00)
LDL Cholesterol: 61 mg/dL (ref 0–99)
NonHDL: 94.82
Total CHOL/HDL Ratio: 3
Triglycerides: 167 mg/dL — ABNORMAL HIGH (ref 0.0–149.0)
VLDL: 33.4 mg/dL (ref 0.0–40.0)

## 2022-09-20 LAB — ALT: ALT: 33 U/L (ref 0–53)

## 2022-09-20 NOTE — Progress Notes (Signed)
   Subjective:    Patient ID: Gary Curry, male    DOB: 1960-12-29, 62 y.o.   MRN: [id number]  DOS:  09/20/2022 Type of visit - description: Follow-up  Since LOV, had calcium coronary score, started atorvastatin. Good compliance, no apparent side effects. Denies nausea vomiting or unusual aches or pains. He remains very active.   Review of Systems See above   Past Medical History:  Diagnosis Date   Allergy    seasonal allergies   Basal cell carcinoma 12 years ago   sees derm Dr Martinique   H/O hypogonadism    used to see Dr Chalmers Cater   Hyperlipidemia    Hypertension    MVP (mitral valve prolapse)    question of.., dx in the 90s    Past Surgical History:  Procedure Laterality Date   BACK SURGERY  2000/2002   microdiscectomy   VARICOCELE EXCISION  2011    Current Outpatient Medications  Medication Instructions   amLODipine (NORVASC) 5 mg, Oral, Daily   atorvastatin (LIPITOR) 20 mg, Oral, Daily at bedtime   cetirizine (ZYRTEC) 10 mg, Oral, Daily   fluticasone (FLONASE) 50 MCG/ACT nasal spray SHAKE LIQUID AND USE 2 SPRAYS IN EACH NOSTRIL DAILY AS NEEDED FOR ALLERGIES OR RHINITIS   losartan (COZAAR) 100 mg, Oral, Daily   Multiple Vitamin (MULTIVITAMIN) capsule 1 capsule, Oral, Daily, Nature Made MVI with Omega 3 Gummies-Take 2 daily   psyllium (METAMUCIL) 58.6 % powder 1 packet, Oral, Daily   sildenafil (REVATIO) 20 MG tablet TAKE 3 TO 4 TABLETS(60 TO 80 MG) BY MOUTH AT BEDTIME AS NEEDED       Objective:   Physical Exam BP 132/78   Pulse 77   Temp 98.1 F (36.7 C) (Oral)   Resp 16   Ht 6\' 2"  (1.88 m)   Wt 188 lb 2 oz (85.3 kg)   SpO2 97%   BMI 24.15 kg/m  General:   Well developed, NAD, BMI noted. HEENT:  Normocephalic . Face symmetric, atraumatic Lungs:  CTA B Normal respiratory effort, no intercostal retractions, no accessory muscle use. Heart: RRR,  no murmur.  Lower extremities: no pretibial edema bilaterally  Skin: Not pale. Not jaundice Neurologic:   alert & oriented X3.  Speech normal, gait appropriate for age and unassisted Psych--  Cognition and judgment appear intact.  Cooperative with normal attention span and concentration.  Behavior appropriate. No anxious or depressed appearing.      Assessment   Assessment HTN Hyperlipidemia BCC, dermatology Dr. Martinique 08/02/2022: Coronary calcium score 34, 52nd percentile, RX  atorvastatin and aspirin H/o hypogonadism used to see Dr. Chalmers Cater, nl free T 2015 H/o elevated LFTs: Hepatitis B and C negative HOH, mild, saw ENT 02/2021   PLAN: HTN: Well-controlled on amlodipine, losartan.  Check BMP.  Ambulatory BPs are good as low as 118/77. Hyperlipidemia: Started atorvastatin 07-2022 based on calcium coronary score result.  He also started aspirin, good compliance, no side effects. He continue with excellent lifestyle. Check FLP AST ALT. RTC 03-2019 for CPX

## 2022-09-20 NOTE — Patient Instructions (Addendum)
Check the  blood pressure regularly BP GOAL is between 110/65 and  135/85. If it is consistently higher or lower, let me know      GO TO THE LAB : Get the blood work     [place], [place] back for a physical exam by October 2024

## 2022-09-21 NOTE — Assessment & Plan Note (Signed)
HTN: Well-controlled on amlodipine, losartan.  Check BMP.  Ambulatory BPs are good as low as 118/77. Hyperlipidemia: Started atorvastatin 07-2022 based on calcium coronary score result.  He also started aspirin, good compliance, no side effects. He continue with excellent lifestyle. Check FLP AST ALT. RTC 03-2019 for CPX

## 2022-09-25 MED ORDER — ATORVASTATIN CALCIUM 20 MG PO TABS: 20.0000 mg | ORAL_TABLET | Freq: Every day | ORAL | 3 refills | Status: DC

## 2022-09-25 NOTE — Addendum Note (Signed)
Addended byDamita Dunnings D on: 09/25/2022 09:45 AM   Modules accepted: Orders

## 2022-10-10 DIAGNOSIS — D225 Melanocytic nevi of trunk: Secondary | ICD-10-CM | POA: Diagnosis not present

## 2022-10-10 DIAGNOSIS — L853 Xerosis cutis: Secondary | ICD-10-CM | POA: Diagnosis not present

## 2022-10-10 DIAGNOSIS — L814 Other melanin hyperpigmentation: Secondary | ICD-10-CM | POA: Diagnosis not present

## 2022-10-10 DIAGNOSIS — L218 Other seborrheic dermatitis: Secondary | ICD-10-CM | POA: Diagnosis not present

## 2022-10-11 ENCOUNTER — Other Ambulatory Visit: Payer: Self-pay | Admitting: Internal Medicine

## 2022-10-23 ENCOUNTER — Encounter: Payer: Self-pay | Admitting: Internal Medicine

## 2022-11-27 ENCOUNTER — Encounter: Payer: Self-pay | Admitting: Internal Medicine

## 2022-12-18 ENCOUNTER — Encounter: Payer: Self-pay | Admitting: Internal Medicine

## 2023-01-03 ENCOUNTER — Other Ambulatory Visit: Payer: Self-pay | Admitting: Internal Medicine

## 2023-01-04 ENCOUNTER — Encounter: Payer: BC Managed Care – PPO | Admitting: Internal Medicine

## 2023-01-18 ENCOUNTER — Encounter: Payer: BC Managed Care – PPO | Admitting: Internal Medicine

## 2023-02-11 ENCOUNTER — Ambulatory Visit (AMBULATORY_SURGERY_CENTER): Payer: BC Managed Care – PPO

## 2023-02-11 VITALS — Ht 74.0 in | Wt 188.0 lb

## 2023-02-11 DIAGNOSIS — Z86010 Personal history of colonic polyps: Secondary | ICD-10-CM

## 2023-02-11 MED ORDER — NA SULFATE-K SULFATE-MG SULF 17.5-3.13-1.6 GM/177ML PO SOLN: 1.0000 | Freq: Once | ORAL | 0 refills | Status: AC

## 2023-02-11 NOTE — Progress Notes (Signed)

## 2023-02-19 ENCOUNTER — Encounter: Payer: Self-pay | Admitting: Internal Medicine

## 2023-02-27 ENCOUNTER — Telehealth: Payer: Self-pay | Admitting: Internal Medicine

## 2023-02-27 NOTE — Telephone Encounter (Signed)
Inbound call from patient requesting to discuss cost estimate for 9/10 procedure. Please advise, thank you.

## 2023-03-05 ENCOUNTER — Encounter: Payer: BC Managed Care – PPO | Admitting: Internal Medicine

## 2023-03-21 ENCOUNTER — Encounter: Payer: BC Managed Care – PPO | Admitting: Internal Medicine

## 2023-04-02 ENCOUNTER — Other Ambulatory Visit: Payer: Self-pay | Admitting: Internal Medicine

## 2023-04-11 ENCOUNTER — Other Ambulatory Visit: Payer: Self-pay

## 2023-04-12 ENCOUNTER — Encounter: Payer: Self-pay | Admitting: Internal Medicine

## 2023-04-12 ENCOUNTER — Telehealth: Payer: Self-pay

## 2023-04-12 ENCOUNTER — Ambulatory Visit: Payer: BC Managed Care – PPO | Admitting: Internal Medicine

## 2023-04-12 VITALS — BP 126/84 | HR 97 | Temp 97.4°F | Resp 16 | Ht 74.0 in | Wt 189.0 lb

## 2023-04-12 DIAGNOSIS — E785 Hyperlipidemia, unspecified: Secondary | ICD-10-CM

## 2023-04-12 DIAGNOSIS — Z0001 Encounter for general adult medical examination with abnormal findings: Secondary | ICD-10-CM | POA: Diagnosis not present

## 2023-04-12 DIAGNOSIS — I10 Essential (primary) hypertension: Secondary | ICD-10-CM | POA: Diagnosis not present

## 2023-04-12 DIAGNOSIS — Z23 Encounter for immunization: Secondary | ICD-10-CM | POA: Diagnosis not present

## 2023-04-12 DIAGNOSIS — Z125 Encounter for screening for malignant neoplasm of prostate: Secondary | ICD-10-CM | POA: Diagnosis not present

## 2023-04-12 DIAGNOSIS — I4891 Unspecified atrial fibrillation: Secondary | ICD-10-CM | POA: Diagnosis not present

## 2023-04-12 DIAGNOSIS — Z0000 Encounter for general adult medical examination without abnormal findings: Secondary | ICD-10-CM

## 2023-04-12 LAB — CBC WITH DIFFERENTIAL/PLATELET
Basophils Absolute: 0.1 10*3/uL (ref 0.0–0.1)
Basophils Relative: 0.8 % (ref 0.0–3.0)
Eosinophils Absolute: 0.1 10*3/uL (ref 0.0–0.7)
Eosinophils Relative: 1.4 % (ref 0.0–5.0)
HCT: 48.8 % (ref 39.0–52.0)
Hemoglobin: 16.3 g/dL (ref 13.0–17.0)
Lymphocytes Relative: 27.1 % (ref 12.0–46.0)
Lymphs Abs: 2.1 10*3/uL (ref 0.7–4.0)
MCHC: 33.3 g/dL (ref 30.0–36.0)
MCV: 90.6 fL (ref 78.0–100.0)
Monocytes Absolute: 0.5 10*3/uL (ref 0.1–1.0)
Monocytes Relative: 6.6 % (ref 3.0–12.0)
Neutro Abs: 4.8 10*3/uL (ref 1.4–7.7)
Neutrophils Relative %: 64.1 % (ref 43.0–77.0)
Platelets: 217 10*3/uL (ref 150.0–400.0)
RBC: 5.39 Mil/uL (ref 4.22–5.81)
RDW: 13.2 % (ref 11.5–15.5)
WBC: 7.6 10*3/uL (ref 4.0–10.5)

## 2023-04-12 LAB — COMPREHENSIVE METABOLIC PANEL WITH GFR
ALT: 47 U/L (ref 0–53)
AST: 25 U/L (ref 0–37)
Albumin: 4.6 g/dL (ref 3.5–5.2)
Alkaline Phosphatase: 68 U/L (ref 39–117)
BUN: 19 mg/dL (ref 6–23)
CO2: 29 meq/L (ref 19–32)
Calcium: 9.9 mg/dL (ref 8.4–10.5)
Chloride: 103 meq/L (ref 96–112)
Creatinine, Ser: 0.95 mg/dL (ref 0.40–1.50)
GFR: 86.76 mL/min
Glucose, Bld: 94 mg/dL (ref 70–99)
Potassium: 4.5 meq/L (ref 3.5–5.1)
Sodium: 140 meq/L (ref 135–145)
Total Bilirubin: 1.5 mg/dL — ABNORMAL HIGH (ref 0.2–1.2)
Total Protein: 7 g/dL (ref 6.0–8.3)

## 2023-04-12 LAB — LIPID PANEL
Cholesterol: 144 mg/dL (ref 0–200)
HDL: 42.4 mg/dL (ref >39.00)
LDL Cholesterol: 76 mg/dL (ref 0–99)
NonHDL: 101.18
Total CHOL/HDL Ratio: 3
Triglycerides: 128 mg/dL (ref 0.0–149.0)
VLDL: 25.6 mg/dL (ref 0.0–40.0)

## 2023-04-12 LAB — MAGNESIUM: Magnesium: 1.9 mg/dL (ref 1.5–2.5)

## 2023-04-12 LAB — PSA: PSA: 2.72 ng/mL (ref 0.10–4.00)

## 2023-04-12 LAB — TSH: TSH: 1.33 u[IU]/mL (ref 0.35–5.50)

## 2023-04-12 MED ORDER — APIXABAN 5 MG PO TABS: 5.0000 mg | ORAL_TABLET | Freq: Two times a day (BID) | ORAL | 3 refills | Status: AC

## 2023-04-12 NOTE — Patient Instructions (Addendum)
I found you have a condition called atrial fibrillation.  Your heart rate is irregular. If you develop at any time chest pain or difficulty breathing or palpitations: Seek medical attention. This is a plan I discussed with cardiology: Start Eliquis, and anticoagulant.  Watch for any unusual bleeding, watch for falls and injuries.  Do not take any anti-inflammatory such as ibuprofen or naproxen. Will schedule a echocardiogram and a 30-day monitor. Cardiology will call you and schedule an appointment. Please reach out if you have any questions.    Vaccines I recommend: Covid booster RSV vaccine Shingrix (shingles)   Check the  blood pressure regularly Blood pressure goal:  between 110/65 and  135/85. If it is consistently higher or lower, let me know     GO TO THE LAB : Get the blood work     Next visit with me in 3 months Please schedule it at the front desk

## 2023-04-12 NOTE — Telephone Encounter (Signed)
Wendall Stade, MD  P [facility] Triage; Ethelda Chick, RN Needs new patient evaluation any location or afib clinic New onset afib Dr Drue Novel has ordered labs, TTE and monitor started on eliquis for CHADVASC 2   Called patient to make an appointment for new patient. Left message for patient to call back.

## 2023-04-12 NOTE — Progress Notes (Unsigned)
Subjective:    Patient ID: Gary Curry, male    DOB: [birth year], 61 y.o.   MRN: [id number]  DOS:  04/12/2023 Type of visit - description: cpx  Here for CPX. Doing well. Very active takes long walks, denies chest pain, difficulty breathing or palpitations.   Review of Systems See above   Past Medical History:  Diagnosis Date   Allergy    seasonal allergies   Basal cell carcinoma 12 years ago   sees derm Dr [place]   H/O hypogonadism    used to see Dr Talmage Nap   Hyperlipidemia    Hypertension    MVP (mitral valve prolapse)    question of.., dx in the 90s    Past Surgical History:  Procedure Laterality Date   BACK SURGERY  2000/2002   microdiscectomy   VARICOCELE EXCISION  2011    Current Outpatient Medications  Medication Instructions   amLODipine (NORVASC) 5 mg, Oral, Daily   atorvastatin (LIPITOR) 20 mg, Oral, Daily at bedtime   cetirizine (ZYRTEC) 10 mg, Oral, Daily   fluticasone (FLONASE) 50 MCG/ACT nasal spray 2 sprays, Each Nare, Daily PRN   ketoconazole (NIZORAL) 2 % shampoo Topical   losartan (COZAAR) 100 mg, Oral, Daily   Multiple Vitamin (MULTIVITAMIN) capsule 1 capsule, Oral, Daily, Nature Made MVI with Omega 3 Gummies-Take 2 daily   psyllium (METAMUCIL) 58.6 % powder 1 packet, Oral, Daily   sildenafil (REVATIO) 20 MG tablet TAKE 3 TO 4 TABLETS(60 TO 80 MG) BY MOUTH AT BEDTIME AS NEEDED       Objective:   Physical Exam BP 126/84   Pulse 97   Temp (!) 97.4 F (36.3 C) (Oral)   Resp 16   Ht 6\' 2"  (1.88 m)   Wt 189 lb (85.7 kg)   SpO2 96%   BMI 24.27 kg/m  General: Well developed, NAD, BMI noted Neck: No  thyromegaly  HEENT:  Normocephalic . Face symmetric, atraumatic Lungs:  CTA B Normal respiratory effort, no intercostal retractions, no accessory muscle use. Heart: regular ,  no murmur.  Abdomen:  Not distended, soft, non-tender. No rebound or rigidity.   Lower extremities: no pretibial edema bilaterally  Skin: Exposed areas without  rash. Not pale. Not jaundice Neurologic:  alert & oriented X3.  Speech normal, gait appropriate for age and unassisted Strength symmetric and appropriate for age.  Psych: Cognition and judgment appear intact.  Cooperative with normal attention span and concentration.  Behavior appropriate. No anxious or depressed appearing.     Assessment    Assessment HTN Hyperlipidemia BCC, sees derm  08/02/2022: Coronary calcium score 34, 52nd percentile, RX  atorvastatin and aspirin H/o hypogonadism used to see Dr. Talmage Nap, nl free T 2015 H/o elevated LFTs: Hepatitis B and C negative HOH, mild, saw ENT 02/2021   PLAN: Here for CPX - Td 2016 - flu shot today -Vaccines are recommended: COVID booster, RSV, -- CCS: Cscope 06-2012,  2 polyps; had a  cscope 11/2017, due for colonoscopy, already contacted GI -- prostate ca screening:    check PSA --Die-exercise:  walks > 15 miles a week, very healthy diet. - Labs:  CMP FLP CBC TSH PSA magnesium -Healthcare POA: See AVS   HTN:On amlodipine, losartan, checking labs. Atrial fibrillation: Heart rate was slightly regular today, EKG: Atrial fibrillation, new onset.  He is completely asymptomatic CHA2DS2-VASc score: Male, 62 y/o, HTN, elevated calcium coronary score Situation discussed with cardiology, plan: Eliquis, echo, 30-day monitor, will see cardiology soon. Risk of  Eliquis discussed with patient.  See AVS. (Due to where he lives, he may need to see a different cardiology group) High cholesterol: On atorvastatin.  Checking labs BCC: Sees dermatology regularly. RTC 3 months ==  HTN: Well-controlled on amlodipine, losartan.  Check BMP.  Ambulatory BPs are good as low as 118/77. Hyperlipidemia: Started atorvastatin 07-2022 based on calcium coronary score result.  He also started aspirin, good compliance, no side effects. He continue with excellent lifestyle. Check FLP AST ALT. RTC 03-2019 for CPX

## 2023-04-13 ENCOUNTER — Encounter: Payer: Self-pay | Admitting: Internal Medicine

## 2023-04-13 NOTE — Assessment & Plan Note (Signed)
Here for CPX - Td 2016 - flu shot today -Vaccines are recommended: COVID booster, RSV, -- CCS: Cscope 06-2012,  2 polyps; had a  cscope 11/2017, due for colonoscopy, pt already contacted GI -- prostate ca screening:    check PSA --Die-exercise:  walks > 15 miles a week, very healthy diet. - Labs:  CMP FLP CBC TSH PSA magnesium -Healthcare POA: See AVS

## 2023-04-13 NOTE — Assessment & Plan Note (Signed)
Here for CPX  Atrial fibrillation, new : Heart rate was slightly regular today, EKG confirmed Atrial fibrillation, new onset.  He is completely asymptomatic CHA2DS2-VASc score: Male, 62 y/o, HTN, elevated calcium coronary score Situation discussed with cardiology, plan: Eliquis, echo, 30-day monitor, will see cardiology soon. Risk of Eliquis discussed with patient.  See AVS. (Due to where he lives, he may need to see a different cardiology group) HTN:On amlodipine, losartan, checking labs. High cholesterol: On atorvastatin.  Checking labs BCC: Sees dermatology regularly. RTC 3 months

## 2023-04-15 ENCOUNTER — Telehealth: Payer: Self-pay | Admitting: Internal Medicine

## 2023-04-15 DIAGNOSIS — I4891 Unspecified atrial fibrillation: Secondary | ICD-10-CM

## 2023-04-15 NOTE — Telephone Encounter (Signed)
Echo and event monitor cancelled. Cardiology referral placed.

## 2023-04-15 NOTE — Addendum Note (Signed)
Addended by: Conrad What Cheer D on: 04/15/2023 10:37 AM   Modules accepted: Orders

## 2023-04-15 NOTE — Telephone Encounter (Signed)
Please advise 

## 2023-04-15 NOTE — Addendum Note (Signed)
Addended by: Conrad [place] D on: 04/15/2023 10:38 AM   Modules accepted: Orders

## 2023-04-15 NOTE — Telephone Encounter (Signed)
Okay to place a referral. We can cancel the echo and cardiac event monitor I ordered. Otherwise medications are the same.

## 2023-04-15 NOTE — Telephone Encounter (Signed)
Pt called stating that Dr. Drue Novel had told him to get back with him on what direction he wanted to go regarding his AFIB. Pt stated that he would like to have a referral put in to the following provider:  Theotis Barrio. Mayme Genta, MD [facility] [address], [place], [place] [address] P: [phone]

## 2023-04-24 NOTE — Telephone Encounter (Signed)
Hey can probably see afib clinic faster. We are currently booked out until the week of Christmas for NP's

## 2023-04-25 NOTE — Telephone Encounter (Signed)
Left message for patient to call back  

## 2023-06-11 DIAGNOSIS — I739 Peripheral vascular disease, unspecified: Secondary | ICD-10-CM | POA: Diagnosis not present

## 2023-06-11 DIAGNOSIS — I4891 Unspecified atrial fibrillation: Secondary | ICD-10-CM | POA: Diagnosis not present

## 2023-06-21 ENCOUNTER — Other Ambulatory Visit: Payer: Self-pay | Admitting: Internal Medicine

## 2023-07-11 ENCOUNTER — Telehealth: Payer: Self-pay

## 2023-07-11 ENCOUNTER — Encounter: Payer: Self-pay | Admitting: Internal Medicine

## 2023-07-11 NOTE — Telephone Encounter (Signed)
Copied from CRM [id number]. Topic: Clinical - Medical Advice >> Jul 11, 2023  3:52 PM Elita Quick wrote: Reason for CRM: PATIENT RECEIVE A LETTER FROM Conrad [place], CMA STATING PATIENT NEEDED TO MAKE AN APPOINTMENT WITH DR PAZ. PATIENT IS DECLINING STATING HE SPOKE WITH HIS CARDIOLOGIST AND THE RISK WHERE GREATER WITH THE ELQUIS AND ADVISED AGAINST IT SO PATIENT IS NOT TAKING THE MEDICATION DUE TO AFIB AND PER HIS CARDIOLOGIST. PLEASE CONTACT PATIENT

## 2023-07-12 ENCOUNTER — Ambulatory Visit: Payer: BC Managed Care – PPO | Admitting: Internal Medicine

## 2023-07-12 ENCOUNTER — Encounter: Payer: Self-pay | Admitting: Internal Medicine

## 2023-07-12 NOTE — Telephone Encounter (Addendum)
Saw cardiology 06/11/2023, next visit with them September 26, 2023. I will send the patient a message: Gary Curry, I know you saw your cardiologist June 11, 2023, after reviewing the chart is not clear to me if he recommended anticoagulation or not.  Nevertheless you have an appointment with him on April 2025, please keep the appointment and continue the conversation about Eliquis.

## 2023-11-25 ENCOUNTER — Other Ambulatory Visit: Payer: Self-pay | Admitting: Internal Medicine

## 2023-11-29 ENCOUNTER — Other Ambulatory Visit: Payer: Self-pay | Admitting: Internal Medicine

## 2023-11-29 ENCOUNTER — Telehealth: Payer: Self-pay

## 2023-11-29 NOTE — Telephone Encounter (Signed)
 Copied from CRM [id number]. Topic: Clinical - Medication Question >> Nov 29, 2023  2:18 PM Adonis Hoot wrote: Reason for CRM: Patient would like to know why he only received a 30 day supply of  losartan  (COZAAR ) 100 MG tablet atorvastatin  (LIPITOR) 20 MG tablet?

## 2023-11-29 NOTE — Telephone Encounter (Signed)
 Tried calling Pt back- someone answered but never said anything, he only received a 30 day supply because Dr. Neomi Banks wanted to see him back in January 2025- see AVS from 03/2023. Will need a visit for further refills. Okay for E2C2 to relay.

## 2023-12-04 ENCOUNTER — Other Ambulatory Visit: Payer: Self-pay | Admitting: Internal Medicine

## 2024-01-08 ENCOUNTER — Other Ambulatory Visit: Payer: Self-pay | Admitting: Internal Medicine

## 2024-01-11 ENCOUNTER — Other Ambulatory Visit: Payer: Self-pay | Admitting: Internal Medicine

## 2024-04-13 ENCOUNTER — Encounter: Admitting: Internal Medicine
